# Patient Record
Sex: Female | Born: 1990 | Race: Black or African American | Hispanic: No | Smoking: Never smoker
Health system: Southern US, Community
[De-identification: ages and names within clinical notes are randomized; demographics above are authoritative.]

## PROBLEM LIST (undated history)

## (undated) ENCOUNTER — Inpatient Hospital Stay (HOSPITAL_COMMUNITY): Payer: Self-pay

## (undated) DIAGNOSIS — K759 Inflammatory liver disease, unspecified: Secondary | ICD-10-CM

## (undated) HISTORY — PX: NO PAST SURGERIES: SHX2092

---

## 2017-07-24 ENCOUNTER — Ambulatory Visit (HOSPITAL_COMMUNITY)
Admission: RE | Admit: 2017-07-24 | Discharge: 2017-07-24 | Disposition: A | Discharge status: Home / Self Care | Payer: Medicaid Other | Source: Ambulatory Visit | Attending: Family Medicine | Admitting: Family Medicine

## 2017-07-24 ENCOUNTER — Ambulatory Visit (INDEPENDENT_AMBULATORY_CARE_PROVIDER_SITE_OTHER): Payer: Medicaid Other | Admitting: Family Medicine

## 2017-07-24 ENCOUNTER — Other Ambulatory Visit: Payer: Self-pay

## 2017-07-24 VITALS — BP 100/58 | HR 69 | Temp 98.1°F | Ht 65.0 in | Wt 146.0 lb

## 2017-07-24 DIAGNOSIS — Z0289 Encounter for other administrative examinations: Secondary | ICD-10-CM | POA: Diagnosis not present

## 2017-07-24 DIAGNOSIS — Q740 Other congenital malformations of upper limb(s), including shoulder girdle: Secondary | ICD-10-CM

## 2017-07-24 DIAGNOSIS — Z3490 Encounter for supervision of normal pregnancy, unspecified, unspecified trimester: Secondary | ICD-10-CM | POA: Diagnosis not present

## 2017-07-24 DIAGNOSIS — M25439 Effusion, unspecified wrist: Secondary | ICD-10-CM

## 2017-07-24 DIAGNOSIS — M21932 Unspecified acquired deformity of left forearm: Secondary | ICD-10-CM | POA: Insufficient documentation

## 2017-07-24 HISTORY — DX: Encounter for other administrative examinations: Z02.89

## 2017-07-24 HISTORY — DX: Other congenital malformations of upper limb(s), including shoulder girdle: Q74.0

## 2017-07-24 NOTE — Assessment & Plan Note (Signed)
She has not seen or had any prenatal care thus far.  She has a follow-up appointment tomorrow at the health department for her first prenatal visit.  We did obtain fundal height today which were consistent with her last menstrual period.  Heart tones were in the 140s.  No red flags

## 2017-07-24 NOTE — Assessment & Plan Note (Signed)
BL wrist swelling.   - Left wrist prominent over ulnar styloid.  Tender to deep palpation.  I expect this to be a ganglion cyst - however it is much more hard and adhered than I expected.  I am unable to differentiate any difference from the ulnar bone.  We will obtain x-rays to further elucidate this.  If the x-ray is not helpful we will send for ultrasound.

## 2017-07-24 NOTE — Progress Notes (Signed)
Swahili interpreter Sullivan LoneGilbert from [employer] utilized during today's visit.  [facility] New [facility] Visit  HPI:  [facility] a new patient appointment to establish general primary care.  She reports that her last menstrual period was in June of this year.  She is unsure exactly when.  She denies she is pregnant.  [facility] to establish there is a patient for pregnancy..  ROS: No headaches.  No lower extremity edema.  No vaginal bleeding.  She has occasional back pain.  Also some dental pain.  Otherwise negative.   Past Medical Hx:  -Denies  Past Surgical Hx:  -Denies  Family Hx: updated in Epic - Number of family members: Husband and 1 daughter aged 1 year and 3 months.  [place] are both living in the [place] with her.  Rest of the family [place] back in [place].  Immigrant Social History: - Date arrived in [place]: June 27, [place] - Country of origin[place] - Location of refugee camp (if applicable), how long there, and what caused patient to leave home country?:  [place] - Primary language: Swahili  -Requires intepreter (essentially speaks no [place]) - Marriage Status: Married - Sexual activity: With men - Class B conditions: None - Were you beaten or tortured in your country or refugee camp?  No answer  - if yes:  Are you having bad dreams about your experience?     Do you feel "jumpy" or "nervous?"     Do you feel that the experience is happening again?     Are you "super alert" or watchful?    PHYSICAL EXAM: BP (!) 100/58   Pulse 69   Temp 98.1 F (36.7 C) (Oral)   Ht 5\' 5"  (1.651 m)   Wt 146 lb (66.2 kg)   LMP 02/09/2017 (Approximate)   SpO2 100%   BMI 24.30 kg/m  Gen:  Alert, cooperative patient who appears stated age in no acute distress.  Vital signs reviewed. Head: [place]/AT.   Eyes:  EOMI, PERRL.   Ears:  External ears WNL, Bilateral TM's  normal without retraction, redness or bulging. Nose:  Septum midline  Mouth:  MMM, tonsils non-erythematous, non-edematous.   Cardiac:  Regular rate and rhythm without murmur auscultated.  Good S1/S2. Pulm:  Clear to auscultation bilaterally with good air movement.  No wheezes or rales noted.   Abd: Fundal height at 18 cm.  Fetal heart tones 140s.  Good bowel sounds. Extremities: No lower extremity edema Musculoskeletal patient is a 5 cm x 5 cm swelling located over the dorsum of her ulnar styloid process.  This is tender to palpation with deep palpation.  This feels very hard and fixed and unlike a typical ganglion cyst.  I am unable to separate this from the rest of her ulnar bone.  Also with 2 x 2 centimeter swelling over the right ulnar styloid.  This is nontender. Psych: Very flat affect.  Husband answer most questions.  She would answer in monosyllables if at all.

## 2017-07-24 NOTE — Assessment & Plan Note (Signed)
Overall doing well.  As noted she will be seen in the health department tomorrow. -I am a little concerned because of how flat her affect is.  Her husband answered most of her questions.  We will need to further investigate any past history of trauma or any evidence of PTSD at our next visit without him in the room.   - Labs obtained at health dept.  Will contact them for records.

## 2017-07-24 NOTE — Patient Instructions (Signed)
It was good to you today.  Go to the hospital anytime this week.  You do not need an appointment.  Show them this paper below.  -----------------------------------------------  - MY DOCTOR WANTS ME TO HAVE AN XRAY OF MY WRISTS. - PLEASE HELP ME FIND THE RADIOLOGY DEPARTMENT. - I NEED A SWAHILI INTERPRETER

## 2017-08-17 LAB — OB RESULTS CONSOLE RPR: RPR: NONREACTIVE

## 2017-08-17 LAB — OB RESULTS CONSOLE HEPATITIS B SURFACE ANTIGEN: HEP B S AG: NEGATIVE

## 2017-08-17 LAB — OB RESULTS CONSOLE ABO/RH: RH Type: POSITIVE

## 2017-08-17 LAB — OB RESULTS CONSOLE ANTIBODY SCREEN: Antibody Screen: NEGATIVE

## 2017-08-17 LAB — OB RESULTS CONSOLE GC/CHLAMYDIA
Chlamydia: NEGATIVE
Gonorrhea: NEGATIVE

## 2017-08-17 LAB — OB RESULTS CONSOLE RUBELLA ANTIBODY, IGM: RUBELLA: IMMUNE

## 2017-08-17 LAB — OB RESULTS CONSOLE HIV ANTIBODY (ROUTINE TESTING): HIV: NONREACTIVE

## 2017-08-27 DIAGNOSIS — Z3482 Encounter for supervision of other normal pregnancy, second trimester: Secondary | ICD-10-CM | POA: Diagnosis not present

## 2017-09-11 NOTE — L&D Delivery Note (Signed)
Delivery Note At 7:46 PM a viable female was delivered via Vaginal, Spontaneous (Presentation:LOA).  APGAR: 8, 9; weight pending.  Placenta status: Spontaneous, intact.  Cord: 3 vessels  Anesthesia:  none Episiotomy:  none Lacerations:  1st degree, hemodynamically stable Suture Repair: n/a Est. Blood Loss (mL):  300  Mom to postpartum.  Baby to Couplet care / Skin to Skin.  Jill Huff 11/07/2017, 7:54 PM

## 2017-10-22 LAB — OB RESULTS CONSOLE GBS: GBS: NEGATIVE

## 2017-11-02 DIAGNOSIS — Z3493 Encounter for supervision of normal pregnancy, unspecified, third trimester: Secondary | ICD-10-CM | POA: Diagnosis not present

## 2017-11-02 DIAGNOSIS — B181 Chronic viral hepatitis B without delta-agent: Secondary | ICD-10-CM | POA: Diagnosis not present

## 2017-11-07 ENCOUNTER — Other Ambulatory Visit: Payer: Self-pay

## 2017-11-07 ENCOUNTER — Inpatient Hospital Stay (HOSPITAL_COMMUNITY)
Admission: AD | Admit: 2017-11-07 | Discharge: 2017-11-09 | DRG: 807 | Disposition: A | Discharge status: Home / Self Care | Payer: Medicaid Other | Source: Ambulatory Visit | Attending: Obstetrics & Gynecology | Admitting: Obstetrics & Gynecology

## 2017-11-07 ENCOUNTER — Encounter (HOSPITAL_COMMUNITY): Payer: Self-pay | Admitting: *Deleted

## 2017-11-07 DIAGNOSIS — Z3A38 38 weeks gestation of pregnancy: Secondary | ICD-10-CM

## 2017-11-07 DIAGNOSIS — O4292 Full-term premature rupture of membranes, unspecified as to length of time between rupture and onset of labor: Secondary | ICD-10-CM | POA: Diagnosis present

## 2017-11-07 HISTORY — DX: Inflammatory liver disease, unspecified: K75.9

## 2017-11-07 LAB — CBC
HCT: 42 % (ref 36.0–46.0)
Hemoglobin: 13.9 g/dL (ref 12.0–15.0)
MCH: 26.7 pg (ref 26.0–34.0)
MCHC: 33.1 g/dL (ref 30.0–36.0)
MCV: 80.8 fL (ref 78.0–100.0)
Platelets: 133 10*3/uL — ABNORMAL LOW (ref 150–400)
RBC: 5.2 MIL/uL — ABNORMAL HIGH (ref 3.87–5.11)
RDW: 14.6 % (ref 11.5–15.5)
WBC: 11.9 10*3/uL — ABNORMAL HIGH (ref 4.0–10.5)

## 2017-11-07 LAB — ABO/RH: ABO/RH(D): O POS

## 2017-11-07 LAB — TYPE AND SCREEN
ABO/RH(D): O POS
ANTIBODY SCREEN: NEGATIVE

## 2017-11-07 LAB — POCT FERN TEST: POCT FERN TEST: POSITIVE

## 2017-11-07 MED ORDER — DIBUCAINE 1 % RE OINT
1.0000 | TOPICAL_OINTMENT | RECTAL | Status: DC | PRN
Start: 2017-11-07 — End: 2017-11-09

## 2017-11-07 MED ORDER — DIPHENHYDRAMINE HCL 25 MG PO CAPS: 25.0000 mg | ORAL_CAPSULE | Freq: Four times a day (QID) | ORAL | Status: DC | PRN

## 2017-11-07 MED ORDER — LIDOCAINE HCL (PF) 1 % IJ SOLN
INTRAMUSCULAR | Status: AC
  Filled 2017-11-07: qty 30

## 2017-11-07 MED ORDER — WITCH HAZEL-GLYCERIN EX PADS: 1.0000 "application " | MEDICATED_PAD | CUTANEOUS | Status: DC | PRN

## 2017-11-07 MED ORDER — ONDANSETRON HCL 4 MG/2ML IJ SOLN: 4.0000 mg | Freq: Four times a day (QID) | INTRAMUSCULAR | Status: DC | PRN

## 2017-11-07 MED ORDER — COCONUT OIL OIL: 1.0000 "application " | TOPICAL_OIL | Status: DC | PRN

## 2017-11-07 MED ORDER — IBUPROFEN 600 MG PO TABS
600.0000 mg | ORAL_TABLET | Freq: Four times a day (QID) | ORAL | Status: DC
  Administered 2017-11-08 – 2017-11-09 (×7): 600 mg via ORAL
  Filled 2017-11-07 (×7): qty 1

## 2017-11-07 MED ORDER — BENZOCAINE-MENTHOL 20-0.5 % EX AERO
1.0000 "application " | INHALATION_SPRAY | CUTANEOUS | Status: DC | PRN
  Filled 2017-11-07: qty 56

## 2017-11-07 MED ORDER — OXYTOCIN 40 UNITS IN LACTATED RINGERS INFUSION - SIMPLE MED
INTRAVENOUS | Status: AC
  Filled 2017-11-07: qty 1000

## 2017-11-07 MED ORDER — SOD CITRATE-CITRIC ACID 500-334 MG/5ML PO SOLN: 30.0000 mL | ORAL | Status: DC | PRN

## 2017-11-07 MED ORDER — ACETAMINOPHEN 325 MG PO TABS: 650.0000 mg | ORAL_TABLET | ORAL | Status: DC | PRN

## 2017-11-07 MED ORDER — OXYCODONE-ACETAMINOPHEN 5-325 MG PO TABS
1.0000 | ORAL_TABLET | ORAL | Status: DC | PRN
  Administered 2017-11-07: 1 via ORAL
  Filled 2017-11-07: qty 1

## 2017-11-07 MED ORDER — SIMETHICONE 80 MG PO CHEW: 80.0000 mg | CHEWABLE_TABLET | ORAL | Status: DC | PRN

## 2017-11-07 MED ORDER — LIDOCAINE HCL (PF) 1 % IJ SOLN
30.0000 mL | INTRAMUSCULAR | Status: DC | PRN
  Administered 2017-11-07: 30 mL via SUBCUTANEOUS
  Filled 2017-11-07: qty 30

## 2017-11-07 MED ORDER — ONDANSETRON HCL 4 MG PO TABS: 4.0000 mg | ORAL_TABLET | ORAL | Status: DC | PRN

## 2017-11-07 MED ORDER — SENNOSIDES-DOCUSATE SODIUM 8.6-50 MG PO TABS
2.0000 | ORAL_TABLET | ORAL | Status: DC
  Administered 2017-11-08 (×2): 2 via ORAL
  Filled 2017-11-07 (×2): qty 2

## 2017-11-07 MED ORDER — FENTANYL CITRATE (PF) 100 MCG/2ML IJ SOLN
100.0000 ug | Freq: Once | INTRAMUSCULAR | Status: AC
Start: 2017-11-07 — End: 2017-11-07
  Administered 2017-11-07: 100 ug via INTRAVENOUS

## 2017-11-07 MED ORDER — OXYCODONE-ACETAMINOPHEN 5-325 MG PO TABS: 2.0000 | ORAL_TABLET | ORAL | Status: DC | PRN

## 2017-11-07 MED ORDER — ZOLPIDEM TARTRATE 5 MG PO TABS: 5.0000 mg | ORAL_TABLET | Freq: Every evening | ORAL | Status: DC | PRN

## 2017-11-07 MED ORDER — OXYTOCIN BOLUS FROM INFUSION
500.0000 mL | Freq: Once | INTRAVENOUS | Status: AC
  Administered 2017-11-07: 500 mL via INTRAVENOUS

## 2017-11-07 MED ORDER — TETANUS-DIPHTH-ACELL PERTUSSIS 5-2.5-18.5 LF-MCG/0.5 IM SUSP: 0.5000 mL | Freq: Once | INTRAMUSCULAR | Status: DC

## 2017-11-07 MED ORDER — ACETAMINOPHEN 325 MG PO TABS
650.0000 mg | ORAL_TABLET | ORAL | Status: DC | PRN
  Administered 2017-11-08 – 2017-11-09 (×2): 650 mg via ORAL
  Filled 2017-11-07 (×2): qty 2

## 2017-11-07 MED ORDER — FENTANYL CITRATE (PF) 100 MCG/2ML IJ SOLN
INTRAMUSCULAR | Status: AC
  Filled 2017-11-07: qty 2

## 2017-11-07 MED ORDER — LACTATED RINGERS IV SOLN: INTRAVENOUS | Status: DC

## 2017-11-07 MED ORDER — INFLUENZA VAC SPLIT QUAD 0.5 ML IM SUSY: 0.5000 mL | PREFILLED_SYRINGE | INTRAMUSCULAR | Status: DC

## 2017-11-07 MED ORDER — ONDANSETRON HCL 4 MG/2ML IJ SOLN: 4.0000 mg | INTRAMUSCULAR | Status: DC | PRN

## 2017-11-07 MED ORDER — OXYTOCIN 40 UNITS IN LACTATED RINGERS INFUSION - SIMPLE MED
2.5000 [IU]/h | INTRAVENOUS | Status: DC
  Administered 2017-11-07: 2.5 [IU]/h via INTRAVENOUS

## 2017-11-07 MED ORDER — LACTATED RINGERS IV SOLN: 500.0000 mL | INTRAVENOUS | Status: DC | PRN

## 2017-11-07 MED ORDER — PRENATAL MULTIVITAMIN CH
1.0000 | ORAL_TABLET | Freq: Every day | ORAL | Status: DC
  Administered 2017-11-08 – 2017-11-09 (×2): 1 via ORAL
  Filled 2017-11-07 (×2): qty 1

## 2017-11-07 NOTE — H&P (Signed)
OBSTETRIC ADMISSION HISTORY AND PHYSICAL  Jill Huff is a 27 y.o. female G2P1001 with IUP at [redacted]w[redacted]d by LMP presenting for spontaneous onset of labor with rupture of membranes. She reports clear watery discharged since 0400 and contractions that have gotten progressively worse. She reports +FMs, some bloody show, no blurry vision, headaches or peripheral edema, and RUQ pain.  She plans on breast feeding. She request condoms for birth control. She received her prenatal [facility] at [facility]   Dating: By LMP --->  Estimated Date of Delivery: 11/18/17  Prenatal History/Complications:  Past Medical History: Past Medical History:  Diagnosis Date  . Hepatitis     Past Surgical History: Past Surgical History:  Procedure Laterality Date  . NO PAST SURGERIES      Obstetrical History: OB History    Gravida Para Term Preterm AB Living   2 1 1     1    SAB TAB Ectopic Multiple Live Births                  Social History: Social History   Socioeconomic History  . Marital status: Unknown    Spouse name: None  . Number of children: None  . Years of education: None  . Highest education level: None  Social Needs  . Financial resource strain: None  . Food insecurity - worry: None  . Food insecurity - inability: None  . Transportation needs - medical: None  . Transportation needs - non-medical: None  Occupational History  . None  Tobacco Use  . Smoking status: Never Smoker  . Smokeless tobacco: Never Used  Substance and Sexual Activity  . Alcohol use: No    Frequency: Never  . Drug use: No  . Sexual activity: Yes  Other Topics Concern  . None  Social History Narrative  . None    Family History: History reviewed. No pertinent family history.  Allergies: No Known Allergies  No medications prior to admission.     Review of Systems   All systems reviewed and negative except as stated in HPI  Blood pressure 118/84, pulse 69, temperature 98.1 F (36.7 C), temperature source  Oral, resp. rate 18, height 5\' 5"  (1.651 m), weight 162 lb (73.5 kg), last menstrual period 02/09/2017, SpO2 100 %. General appearance: alert, cooperative and no distress Lungs: clear to auscultation bilaterally Heart: regular rate and rhythm Abdomen: soft, non-tender; bowel sounds normal Pelvic: n/a Extremities: Homans sign is negative, no sign of DVT DTR's +2 Presentation: cephalic Fetal monitoringBaseline: 140 bpm, Variability: Good {> 6 bpm), Accelerations: Reactive and Decelerations: Absent Uterine activityFrequency: Every 1-2 minutes Dilation: Lip/rim Effacement (%): 100 Station: -1 Exam by:: Dorrene German RN   Prenatal labs: ABO, Rh:   Antibody:   Rubella:   RPR:    HBsAg:    HIV:    GBS: Negative (02/11 0000)    Prenatal Transfer Tool  Maternal Diabetes: No Genetic Screening: Normal Maternal Ultrasounds/Referrals: Normal Fetal Ultrasounds or other Referrals:  None Maternal Substance Abuse:  No Significant Maternal Medications:  None Significant Maternal Lab Results: Lab values include: Group B Strep negative  Results for orders placed or performed during the hospital encounter of 11/07/17 (from the past 24 hour(s))  POCT fern test   Collection Time: 11/07/17  5:55 PM  Result Value Ref Range   POCT Fern Test Positive = ruptured amniotic membanes   CBC   Collection Time: 11/07/17  6:13 PM  Result Value Ref Range   WBC 11.9 (H) 4.0 -  10.5 K/uL   RBC 5.20 (H) 3.87 - 5.11 MIL/uL   Hemoglobin 13.9 12.0 - 15.0 g/dL   HCT 16.142.0 09.636.0 - 04.546.0 %   MCV 80.8 78.0 - 100.0 fL   MCH 26.7 26.0 - 34.0 pg   MCHC 33.1 30.0 - 36.0 g/dL   RDW 40.914.6 81.111.5 - 91.415.5 %   Platelets 133 (L) 150 - 400 K/uL    Patient Active Problem List   Diagnosis Date Noted  . Indication for care in labor or delivery 11/07/2017  . Refugee health examination 07/24/2017  . Encounter for supervision of normal pregnancy 07/24/2017  . Wrist swelling, unspecified laterality 07/24/2017    Assessment/Plan:   Jill Huff is a 27 y.o. G2P1001 at 5959w3d here for SROM, spontaneous onset of labor  #Labor:Expectant management, anticipate NSVD soon #Pain: Per patient request #FWB: Cat 1 #ID:  GBS neg #MOF: Breast #MOC: Condom #Circ:  n/a  Rolm BookbinderCaroline M Yamaris Cummings, CNM  11/07/2017, 6:34 PM

## 2017-11-07 NOTE — Progress Notes (Signed)
[facility]mission [facility]

## 2017-11-07 NOTE — MAU Note (Signed)
Pt C/O uc's since 0400, states she has had watery bloody discharge.

## 2017-11-07 NOTE — Progress Notes (Signed)
Interpreter called

## 2017-11-08 LAB — CBC
HEMATOCRIT: 37.5 % (ref 36.0–46.0)
Hemoglobin: 12.3 g/dL (ref 12.0–15.0)
MCH: 26.2 pg (ref 26.0–34.0)
MCHC: 32.8 g/dL (ref 30.0–36.0)
MCV: 80 fL (ref 78.0–100.0)
Platelets: 126 10*3/uL — ABNORMAL LOW (ref 150–400)
RBC: 4.69 MIL/uL (ref 3.87–5.11)
RDW: 14.4 % (ref 11.5–15.5)
WBC: 9.3 10*3/uL (ref 4.0–10.5)

## 2017-11-08 LAB — RPR: RPR: NONREACTIVE

## 2017-11-08 NOTE — Lactation Note (Signed)
This note was copied from a baby's chart. Lactation Consultation Note Baby 9 hrs old. Baby BF after delivery but hasn't BF since. Mom stated baby sleepy. Baby laying in crib awake looking around quietly.  Used Swahili interpreter [employer] [phone] Jill Huff. Mom plans on breast/formula feed baby. Mom has 1 1/25 yr old that she BF for 1 yr. Mom BF exclusively for 6 months then used formula w/BM.  Mom has round breast w/small everted nipples. Mom has some stretch marks from shoulders down towards axillary to upper chest. Loraine LericheMark to Lt. Outer is very wide looks like a scar. Mom stated it isn't a scar. Noted a couple of stretch marks to upper breast. After thinking about marks, wonder if were cuts. Appear to be symmetrical. Unwrapped baby and assisted in latch in cradle position. Baby searched and pops and on frequently.  Newborn behavior, STS, I&O, supply and demand discussed. Mom encouraged to feed baby 8-12 times/24 hours and with feeding cues. Mom encouraged to waken baby for feeds if hasn't cued in 3 hrs. Mom stated she had no further questions. Encouraged to call for assistance.   WH/LC brochure given w/resources, support groups and LC services. Patient Name: Jill Radene GunningSolange Huff EAVWU'JToday's Date: 11/08/2017 Reason for consult: Initial assessment   Maternal Data Has patient been taught Hand Expression?: Yes Does the patient have breastfeeding experience prior to this delivery?: Yes  Feeding Feeding Type: Breast Fed Length of feed: 10 min(still BF)  LATCH Score Latch: Repeated attempts needed to sustain latch, nipple held in mouth throughout feeding, stimulation needed to elicit sucking reflex.  Audible Swallowing: None  Type of Nipple: Everted at rest and after stimulation  Comfort (Breast/Nipple): Soft / non-tender  Hold (Positioning): Assistance needed to correctly position infant at breast and maintain latch.  LATCH Score: 6  Interventions Interventions: Breast feeding basics  reviewed;Assisted with latch;Breast compression;Skin to skin;Adjust position;Breast massage;Support pillows;Hand express;Position options  Lactation Tools Discussed/Used WIC Program: Yes   Consult Status Consult Status: Follow-up Date: 11/09/17 Follow-up type: In-patient    Bellarae Lizer, Diamond NickelLAURA G 11/08/2017, 5:07 AM

## 2017-11-08 NOTE — Progress Notes (Signed)
[employer] interpreter [id number] translates maternal & infant care and safety and breast feeding basics. Pt states understanding information.

## 2017-11-08 NOTE — Progress Notes (Signed)
[employer] interpreter [id number] translates [place] Post[place] Depression scale, infant and maternal care and breast feeding. Mother states understanding info.

## 2017-11-08 NOTE — Progress Notes (Signed)
[   LATE ENTRY]  Called to bedside earlier in the evening d/t persistent vaginal bleeding, thought to be coming from lacerations. On evaluation, fundus was firm, and patient had persistent bleeding from perineal tear, as small small amount of bleeding from right periurethral laceration. I repaired lacerations with 3.0 Vycril after given patient IV pain medication and local lidocaine. Good hemostasis was achieved. Fundal massage done after repairs, and she was noted to have moderate amount of blood coming through cervix. Fundus firm. LUS explored and found to have moderate amount of clots which were cleared. Good hemostasis noted after this both from uterus and from repaired lacerations.   Raynelle FanningJulie P. Halie Gass, MD OB Fellow

## 2017-11-08 NOTE — Progress Notes (Signed)
POSTPARTUM PROGRESS NOTE  Post Partum Day 1 Subjective:  Jill Huff is a 27 y.o. G2P1001 3562w4d s/p SVD. Overnight, patient had persistent bleeding noted from perineal tear, repaired at bedside with no complications. Pt denies problems with ambulating, voiding or po intake.  She denies nausea or vomiting.  Pain is well controlled. Lochia Small.   Objective: Blood pressure 99/60, pulse 68, temperature 98.2 F (36.8 C), temperature source Oral, resp. rate 18, height 5\' 5"  (1.651 m), weight 162 lb (73.5 kg), last menstrual period 02/09/2017, SpO2 100 %.  Physical Exam:  General: alert, cooperative and no distress Lochia:normal flow Chest: no respiratory distress Heart:regular rate, distal pulses intact Abdomen: soft, nontender,  Uterine Fundus: firm, appropriately tender Perineal exam without signs of infection. DVT Evaluation: No calf swelling or tenderness Extremities: no edema  Recent Labs    11/07/17 1813 11/08/17 0531  HGB 13.9 12.3  HCT 42.0 37.5    Assessment/Plan:  ASSESSMENT: Jill GunningSolange Males is a 27 y.o. G2P1001 1062w4d s/p SVD  Plan for discharge tomorrow and Breastfeeding   LOS: 1 day   Ellwood Denselison Mosi Hannold, DO 11/08/2017, 7:48 AM

## 2017-11-09 MED ORDER — IBUPROFEN 600 MG PO TABS: 600.0000 mg | ORAL_TABLET | Freq: Four times a day (QID) | ORAL | 0 refills | Status: DC

## 2017-11-09 NOTE — Discharge Summary (Signed)
OB Discharge Summary    Patient Name: Jill GunningSolange Letourneau DOB: 04/14/1991 MRN: [id number] Date of admission: 11/07/2017  Delivering MD: Rolm BookbinderNEILL, CAROLINE M )  Date of discharge: 11/09/2017  Admitting diagnosis: SROM Intrauterine pregnancy: 6036w5d    Secondary diagnosis:  Active Problems:   Patient Active Problem List   Diagnosis Date Noted  . Indication for care in labor or delivery 11/07/2017  . Refugee health examination 07/24/2017  . Encounter for supervision of normal pregnancy 07/24/2017  . Wrist swelling, unspecified laterality 07/24/2017   Additional problems:      Discharge diagnosis: Term Pregnancy Delivered                                                                                                Post partum procedures:repair of persistently bleeding perineal and periurethral lacerations, no complications noted  Complications: None  Hospital course:  Onset of Labor With Vaginal Delivery     27 y.o. yo G2P1001 at 5536w5d was admitted in Active Labor on 11/07/2017. Patient had an uncomplicated labor course as follows:  Membrane Rupture Time/Date: 4:00 AM ,11/07/2017   Intrapartum Procedures: Episiotomy:   None                                        Lacerations:  1st degree [2];Perineal [11];Periurethral [8];2nd degree [3]  Patient had a delivery of a Viable infant. 11/07/2017  Information for the patient's newborn:  Sherald BargeWasi, Girl Zeynep [[id number]][[id number]]  Delivery Method: Vag-Spont    Patient's postpartum course notable for persistently bleeding periurethral and perineal lacerations that were repaired a few hours after delivery with no complications.  She is ambulating, tolerating a regular diet, passing flatus, and urinating well. Patient is discharged home in stable condition on 11/09/17.  Physical exam  Vitals:   11/08/17 1737 11/09/17 0607  BP: 110/64 (!) 94/54  Pulse: 72 70  Resp: 18 17  Temp: 97.8 F (36.6 C) 98 F (36.7 C)  SpO2:  100%    General: alert, cooperative and no  distress CV: regular rate and rhythm Lungs: CTA Lochia: appropriate Uterine Fundus: firm Incision: N/A DVT Evaluation: No evidence of DVT seen on physical exam.  Labs: No results found for this or any previous visit (from the past 24 hour(s)).   Discharge instruction: per After Visit Summary and "Baby and Me Booklet".  After visit meds:  No Known Allergies  Allergies as of 11/09/2017   No Known Allergies     Medication List    TAKE these medications   ibuprofen 600 MG tablet Commonly known as:  ADVIL,MOTRIN Take 1 tablet (600 mg total) by mouth every 6 (six) hours.   prenatal multivitamin Tabs tablet Take 1 tablet by mouth daily at 12 noon.      Diet: routine diet  Activity: Advance as tolerated. Pelvic rest for 6 weeks.   Outpatient follow up:4 weeks Future Appointments: No future appointments.  Follow up Appt: No Follow-up on file.  Postpartum contraception: Condoms  Newborn Data: APGAR (1 MIN): 8  APGAR (5 MINS): 9    Baby Feeding: Breast, on [facility] Disposition:home with mother  Ellwood Dense, DO  11/09/2017

## 2017-11-09 NOTE — Discharge Instructions (Signed)
Vaginal Delivery, Care After °Refer to this sheet in the next few weeks. These instructions provide you with information about caring for yourself after vaginal delivery. Your health care provider may also give you more specific instructions. Your treatment has been planned according to current medical practices, but problems sometimes occur. Call your health care provider if you have any problems or questions. °What can I expect after the procedure? °After vaginal delivery, it is common to have: °· Some bleeding from your vagina. °· Soreness in your abdomen, your vagina, and the area of skin between your vaginal opening and your anus (perineum). °· Pelvic cramps. °· Fatigue. ° °Follow these instructions at home: °Medicines °· Take over-the-counter and prescription medicines only as told by your health care provider. °· If you were prescribed an antibiotic medicine, take it as told by your health care provider. Do not stop taking the antibiotic until it is finished. °Driving ° °· Do not drive or operate heavy machinery while taking prescription pain medicine. °· Do not drive for 24 hours if you received a sedative. °Lifestyle °· Do not drink alcohol. This is especially important if you are breastfeeding or taking medicine to relieve pain. °· Do not use tobacco products, including cigarettes, chewing tobacco, or e-cigarettes. If you need help quitting, ask your health care provider. °Eating and drinking °· Drink at least 8 eight-ounce glasses of water every day unless you are told not to by your health care provider. If you choose to breastfeed your baby, you may need to drink more water than this. °· Eat high-fiber foods every day. These foods may help prevent or relieve constipation. High-fiber foods include: °? Whole grain cereals and breads. °? Brown rice. °? Beans. °? Fresh fruits and vegetables. °Activity °· Return to your normal activities as told by your health care provider. Ask your health care provider  what activities are safe for you. °· Rest as much as possible. Try to rest or take a nap when your baby is sleeping. °· Do not lift anything that is heavier than your baby or 10 lb (4.5 kg) until your health care provider says that it is safe. °· Talk with your health care provider about when you can engage in sexual activity. This may depend on your: °? Risk of infection. °? Rate of healing. °? Comfort and desire to engage in sexual activity. °Vaginal Care °· If you have an episiotomy or a vaginal tear, check the area every day for signs of infection. Check for: °? More redness, swelling, or pain. °? More fluid or blood. °? Warmth. °? Pus or a bad smell. °· Do not use tampons or douches until your health care provider says this is safe. °· Watch for any blood clots that may pass from your vagina. These may look like clumps of dark red, brown, or black discharge. °General instructions °· Keep your perineum clean and dry as told by your health care provider. °· Wear loose, comfortable clothing. °· Wipe from front to back when you use the toilet. °· Ask your health care provider if you can shower or take a bath. If you had an episiotomy or a perineal tear during labor and delivery, your health care provider may tell you not to take baths for a certain length of time. °· Wear a bra that supports your breasts and fits you well. °· If possible, have someone help you with household activities and help care for your baby for at least a few days after   you leave the hospital. °· Keep all follow-up visits for you and your baby as told by your health care provider. This is important. °Contact a health care provider if: °· You have: °? Vaginal discharge that has a bad smell. °? Difficulty urinating. °? Pain when urinating. °? A sudden increase or decrease in the frequency of your bowel movements. °? More redness, swelling, or pain around your episiotomy or vaginal tear. °? More fluid or blood coming from your episiotomy or  vaginal tear. °? Pus or a bad smell coming from your episiotomy or vaginal tear. °? A fever. °? A rash. °? Little or no interest in activities you used to enjoy. °? Questions about caring for yourself or your baby. °· Your episiotomy or vaginal tear feels warm to the touch. °· Your episiotomy or vaginal tear is separating or does not appear to be healing. °· Your breasts are painful, hard, or turn red. °· You feel unusually sad or worried. °· You feel nauseous or you vomit. °· You pass large blood clots from your vagina. If you pass a blood clot from your vagina, save it to show to your health care provider. Do not flush blood clots down the toilet without having your health care provider look at them. °· You urinate more than usual. °· You are dizzy or light-headed. °· You have not breastfed at all and you have not had a menstrual period for 12 weeks after delivery. °· You have stopped breastfeeding and you have not had a menstrual period for 12 weeks after you stopped breastfeeding. °Get help right away if: °· You have: °? Pain that does not go away or does not get better with medicine. °? Chest pain. °? Difficulty breathing. °? Blurred vision or spots in your vision. °? Thoughts about hurting yourself or your baby. °· You develop pain in your abdomen or in one of your legs. °· You develop a severe headache. °· You faint. °· You bleed from your vagina so much that you fill two sanitary pads in one hour. °This information is not intended to replace advice given to you by your health care provider. Make sure you discuss any questions you have with your health care provider. °Document Released: 08/25/2000 Document Revised: 02/09/2016 Document Reviewed: 09/12/2015 °Elsevier Interactive Patient Education © 2018 Elsevier Inc. ° °

## 2017-11-09 NOTE — Progress Notes (Signed)
CSW received consult for assistance with WIC and car seat.  Consult also states limited PNC/refugee from [place].  PNR states MOB is linked with a Social Worker.  These resources are handled by other professionals in the hospital.  A representative from WIC is available in the hospital and Volunteer Services can assist with a car seat for $30 if needed.  CSW will follow CDS, but feels her relocation from [place] to the [place] most likely interfered with PNC at beginning of pregnancy.  Her PNC appears consistent since establishment. 

## 2017-12-24 ENCOUNTER — Ambulatory Visit: Payer: Medicaid Other | Admitting: Family Medicine

## 2018-01-01 ENCOUNTER — Ambulatory Visit (INDEPENDENT_AMBULATORY_CARE_PROVIDER_SITE_OTHER): Payer: Medicaid Other | Admitting: Family Medicine

## 2018-01-01 ENCOUNTER — Other Ambulatory Visit: Payer: Self-pay

## 2018-01-01 ENCOUNTER — Encounter: Payer: Self-pay | Admitting: Family Medicine

## 2018-01-01 VITALS — BP 98/52 | HR 76 | Temp 98.0°F | Ht 65.0 in | Wt 150.4 lb

## 2018-01-01 DIAGNOSIS — H93293 Other abnormal auditory perceptions, bilateral: Secondary | ICD-10-CM

## 2018-01-01 DIAGNOSIS — M25439 Effusion, unspecified wrist: Secondary | ICD-10-CM | POA: Diagnosis not present

## 2018-01-01 HISTORY — DX: Other abnormal auditory perceptions, bilateral: H93.293

## 2018-01-01 NOTE — Progress Notes (Signed)
    Subjective:  Jill GunningSolange Huff is a 27 y.o. female who presents to the [facility] today with a chief complaint of wrist pain bilaterally.   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  She adamantly denies any trauma/injury.   She still has use of hands and with no sensation/motor deficits.  No skin breakdown and no complaints of erythema.  She had some xrays done but has not spoken with anyone about the results at this time that she can recall.  There has been no dramatic increase recently in pain/displacement, just chronic worsening.  She also complains of an occasional "beeping" in her ears, worse on left.  She does not complain of pain or hearing deficit.    Objective:  Physical Exam: BP (!) 98/52   Pulse 76   Temp 98 F (36.7 C) (Oral)   Ht 5\' 5"  (1.651 m)   Wt 150 lb 6.4 oz (68.2 kg)   LMP 02/09/2017 (Approximate)   SpO2 99%   BMI 25.03 kg/m   Gen: NAD, resting comfortably CV: RRR with no murmurs appreciated Pulm: NWOB, CTAB with no crackles, wheezes, or rhonchi GI: Normal bowel sounds present. Soft, Nontender, Nondistended. MSK: Wrist are bilaterally deformed with ulnar deviation at wrist.  Sensation/strneght is intact distally.  No skin changes over bony deformation.   Skin: warm, dry Neuro: grossly normal, moves all extremities Psych: Normal affect and thought content  No results found for this or any previous visit (from the past 72 hour(s)).   Assessment/Plan:  Wrist swelling, unspecified laterality Xrays with significant deviation, no current neuro deficits  referal to hand surgery  Auditory complaints of both ears Complaint of painless "beeping" intermittently in both ears  Ear exam shows clear TM with no erythema in canal, no pain on exam.   Will evaluate in future for spontaneous resolution as there are no current symptoms   Marthenia RollingScott Anahid Eskelson, DO FAMILY MEDICINE RESIDENT -  PGY1 01/01/2018 12:15 PM

## 2018-01-01 NOTE — Assessment & Plan Note (Signed)
Complaint of painless "beeping" intermittently in both ears  Ear exam shows clear TM with no erythema in canal, no pain on exam.   Will evaluate in future for spontaneous resolution as there are no current symptoms

## 2018-01-01 NOTE — Assessment & Plan Note (Signed)
Xrays with significant deviation, no current neuro deficits  referal to hand surgery

## 2018-01-01 NOTE — Patient Instructions (Signed)
It was a pleasure to see you today! Thank you for choosing [facility] for your primary care. Marshia Wirz was seen for wrist pain and bone displacement. Come back to the clinic if you have any other routine health issues, and go to the emergency room if you have any life threatening problems.  We are sending you to see a hand surgeon to see if they can offer solution to the bones being displaced.  Please go to the emergency room if you lose feeling in your hand.    If we did any lab work today, and the results require attention, either me or my nurse will get in touch with you. If everything is normal, you will get a letter in mail and a message via . If you don't hear from us in two weeks, please give us a call. Otherwise, we look forward to seeing you again at your next visit. If you have any questions or concerns before then, please call the clinic at [phone].  Please bring all your medications to every doctors visit  Sign up for My Chart to have easy access to your labs results, and communication with your Primary care physician.    Please check-out at the front desk before leaving the clinic.    Best,  Dr. Marthenia RollingScott Naziah Portee FAMILY MEDICINE RESIDENT - PGY1 01/01/2018 11:47 AM

## 2018-02-10 ENCOUNTER — Encounter (HOSPITAL_COMMUNITY): Payer: Self-pay

## 2018-03-15 DIAGNOSIS — Z3042 Encounter for surveillance of injectable contraceptive: Secondary | ICD-10-CM | POA: Diagnosis not present

## 2018-05-01 ENCOUNTER — Other Ambulatory Visit: Payer: Self-pay | Admitting: Nurse Practitioner

## 2018-05-01 DIAGNOSIS — B181 Chronic viral hepatitis B without delta-agent: Secondary | ICD-10-CM

## 2018-06-25 DIAGNOSIS — Z3009 Encounter for other general counseling and advice on contraception: Secondary | ICD-10-CM | POA: Diagnosis not present

## 2018-06-25 DIAGNOSIS — Z3200 Encounter for pregnancy test, result unknown: Secondary | ICD-10-CM | POA: Diagnosis not present

## 2018-06-25 DIAGNOSIS — Z3042 Encounter for surveillance of injectable contraceptive: Secondary | ICD-10-CM | POA: Diagnosis not present

## 2018-08-23 DIAGNOSIS — Z23 Encounter for immunization: Secondary | ICD-10-CM | POA: Diagnosis not present

## 2018-09-27 DIAGNOSIS — Z3042 Encounter for surveillance of injectable contraceptive: Secondary | ICD-10-CM | POA: Diagnosis not present

## 2018-09-27 DIAGNOSIS — Z3009 Encounter for other general counseling and advice on contraception: Secondary | ICD-10-CM | POA: Diagnosis not present

## 2018-10-29 ENCOUNTER — Other Ambulatory Visit: Payer: Self-pay | Admitting: Nurse Practitioner

## 2018-10-29 DIAGNOSIS — B181 Chronic viral hepatitis B without delta-agent: Secondary | ICD-10-CM | POA: Diagnosis not present

## 2018-11-05 ENCOUNTER — Other Ambulatory Visit: Payer: Medicaid Other

## 2018-11-07 ENCOUNTER — Ambulatory Visit (HOSPITAL_COMMUNITY)
Admission: EM | Admit: 2018-11-07 | Discharge: 2018-11-07 | Disposition: A | Discharge status: Home / Self Care | Payer: Medicaid Other | Attending: Family Medicine | Admitting: Family Medicine

## 2018-11-07 ENCOUNTER — Encounter (HOSPITAL_COMMUNITY): Payer: Self-pay | Admitting: Emergency Medicine

## 2018-11-07 DIAGNOSIS — J020 Streptococcal pharyngitis: Secondary | ICD-10-CM

## 2018-11-07 DIAGNOSIS — M545 Low back pain, unspecified: Secondary | ICD-10-CM

## 2018-11-07 DIAGNOSIS — J029 Acute pharyngitis, unspecified: Secondary | ICD-10-CM

## 2018-11-07 LAB — POCT RAPID STREP A: Streptococcus, Group A Screen (Direct): POSITIVE — AB

## 2018-11-07 MED ORDER — CYCLOBENZAPRINE HCL 5 MG PO TABS: 5.0000 mg | ORAL_TABLET | Freq: Two times a day (BID) | ORAL | 0 refills | Status: DC | PRN

## 2018-11-07 MED ORDER — CETIRIZINE HCL 10 MG PO CAPS: 10.0000 mg | ORAL_CAPSULE | Freq: Every day | ORAL | 0 refills | Status: DC

## 2018-11-07 MED ORDER — IBUPROFEN 800 MG PO TABS: 800.0000 mg | ORAL_TABLET | Freq: Three times a day (TID) | ORAL | 0 refills | Status: DC

## 2018-11-07 MED ORDER — AMOXICILLIN 500 MG PO CAPS: 500.0000 mg | ORAL_CAPSULE | Freq: Two times a day (BID) | ORAL | 0 refills | Status: DC

## 2018-11-07 MED ORDER — AMOXICILLIN 500 MG PO CAPS: 500.0000 mg | ORAL_CAPSULE | Freq: Two times a day (BID) | ORAL | 0 refills | Status: AC

## 2018-11-07 NOTE — Discharge Instructions (Addendum)
Sore Throat  Your rapid strep test was positive today. We will treat you for strep throat with an antibiotic. Please take Amoxicillin as prescribed.   Please continue Tylenol or Ibuprofen for fever and pain. May try salt water gargles, cepacol lozenges, throat spray, or OTC cold relief medicine for throat discomfort. If you also have congestion take a daily anti-histamine like Zyrtec, Claritin, and a oral decongestant to help with post nasal drip that may be irritating your throat.   Stay hydrated and drink plenty of fluids to keep your throat coated relieve irritation.   Back Pain Use anti-inflammatories for pain/swelling. You may take up to 800 mg Ibuprofen every 8 hours with food. You may supplement Ibuprofen with Tylenol [phone] mg every 8 hours.   You may use flexeril as needed to help with pain. This is a muscle relaxer and causes sedation- please use only at bedtime or when you will be home and not have to drive/work- begin with 1 tablet

## 2018-11-07 NOTE — ED Provider Notes (Signed)
[facility]    CSN: [id number] Arrival date & time: 11/07/18  1553     History   Chief Complaint Chief Complaint  Patient presents with  . Sore Throat    HPI [name] is a 28 y.o. 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  Patient states that over the past 2 days she has developed a sore throat.  Painful with swallowing and talking.  Denies associated congestion or cough.  Denies any fevers.  Denies using any inhalers or immunosuppressants.  Denies nausea vomiting or diarrhea. she is not taking medicines for symptoms, she took something at work that a coworker gave her, but she was unsure of what this was and did not have any relief from it.  She is also had back pain in her lower back on both sides.  Denies any injury, increase in activity or heavy lifting.  Denies radiation into legs.  Denies numbness or tingling.  Denies changes in urination or bowel movements.  States that her job is not strenuous.  She tried a topical cream with minimal relief.  HPI  Past Medical History:  Diagnosis Date  . Hepatitis    Hep B positive    Patient Active Problem List   Diagnosis Date Noted  . Auditory complaints of both ears 01/01/2018  . Indication for care in labor or delivery 11/07/2017  . Refugee health examination 07/24/2017  . Encounter for supervision of normal pregnancy 07/24/2017  . Wrist swelling, unspecified laterality 07/24/2017    Past Surgical History:  Procedure Laterality Date  . NO PAST SURGERIES      OB History    Gravida  2   Para  1   Term  1   Preterm      AB      Living  1     SAB      TAB      Ectopic      Multiple      Live Births               Home Medications    Prior to Admission medications   Medication Sig Start Date End Date Taking? Authorizing Provider  amoxicillin (AMOXIL) 500 MG capsule Take 1 capsule (500 mg  total) by mouth 2 (two) times daily for 10 days. 11/07/18 11/17/18  Jill Dayhoff C, PA-Huff  Cetirizine HCl 10 MG CAPS Take 1 capsule (10 mg total) by mouth daily for 10 days. 11/07/18 11/17/18  Jill Blanchet C, PA-Huff  cyclobenzaprine (FLEXERIL) 5 MG tablet Take 1-2 tablets (5-10 mg total) by mouth 2 (two) times daily as needed for muscle spasms. 11/07/18   Jill Schelling C, PA-Huff  ibuprofen (ADVIL,MOTRIN) 800 MG tablet Take 1 tablet (800 mg total) by mouth 3 (three) times daily. 11/07/18   Jill Ellwood C, PA-Huff  Prenatal Vit-Fe Fumarate-FA (PRENATAL MULTIVITAMIN) TABS tablet Take 1 tablet by mouth daily at 12 noon.    [provider]    Family History History reviewed. No pertinent family history.  Social History Social History   Tobacco Use  . Smoking status: Never Smoker  . Smokeless tobacco: Never Used  Substance Use Topics  . Alcohol use: No    Frequency: Never  . Drug use: No     Allergies   Patient has no known allergies.   Review of Systems Review of Systems  Constitutional: Negative for activity change, appetite change,  chills, fatigue and fever.  HENT: Positive for sore throat. Negative for congestion, ear pain, rhinorrhea, sinus pressure and trouble swallowing.   Eyes: Negative for discharge and redness.  Respiratory: Negative for cough, chest tightness and shortness of breath.   Cardiovascular: Negative for chest pain.  Gastrointestinal: Negative for abdominal pain, diarrhea, nausea and vomiting.  Genitourinary: Negative for decreased urine volume and difficulty urinating.  Musculoskeletal: Positive for back pain and myalgias.  Skin: Negative for rash.  Neurological: Negative for dizziness, light-headedness and headaches.     Physical Exam Triage Vital Signs ED Triage Vitals  Enc Vitals Group     BP 11/07/18 1659 109/71     Pulse Rate 11/07/18 1659 71     Resp 11/07/18 1659 20     Temp 11/07/18 1659 98.4 F (36.9 Huff)     Temp Source 11/07/18 1659  Temporal     SpO2 11/07/18 1659 100 %     Weight --      Height --      Head Circumference --      Peak Flow --      Pain Score 11/07/18 1700 8     Pain Loc --      Pain Edu? --      Excl. in GC? --    No data found.  Updated Vital Signs BP 109/71 (BP Location: Right Arm)   Pulse 71   Temp 98.4 F (36.9 Huff) (Temporal)   Resp 20   LMP 11/06/2018   SpO2 100%   Breastfeeding No   Visual Acuity Right Eye Distance:   Left Eye Distance:   Bilateral Distance:    Right Eye Near:   Left Eye Near:    Bilateral Near:     Physical Exam Vitals signs and nursing note reviewed.  Constitutional:      General: She is not in acute distress.    Appearance: She is well-developed.  HENT:     Head: Normocephalic and atraumatic.     Ears:     Comments: Bilateral ears without tenderness to palpation of external auricle, tragus and mastoid, EAC's without erythema or swelling, TM's with good bony landmarks and cone of light. Non erythematous.    Nose:     Comments: Bilateral turbinates swollen, dried blood bilaterally    Mouth/Throat:     Comments: Oral mucosa pink and moist, no tonsillar enlargement or exudate. Posterior pharynx patent and erythematous-visible draining mucus in posterior pharynx, no uvula deviation or swelling. Normal phonation. Eyes:     Conjunctiva/sclera: Conjunctivae normal.  Neck:     Musculoskeletal: Neck supple.  Cardiovascular:     Rate and Rhythm: Normal rate and regular rhythm.     Heart sounds: No murmur.  Pulmonary:     Effort: Pulmonary effort is normal. No respiratory distress.     Breath sounds: Normal breath sounds.  Abdominal:     Palpations: Abdomen is soft.     Tenderness: There is no abdominal tenderness.  Musculoskeletal:     Comments: Nontender to palpation of cervical focal, thoracic and lumbar spine midline, mild tenderness to bilateral lower hip strength 5/5 and equal bilaterally, knee strength 5/5 and equal bilaterally, patellar reflexes 2+  bilaterally  Ambulating independently without abnormality  Skin:    General: Skin is warm and dry.  Neurological:     Mental Status: She is alert.      UC Treatments / Results  Labs (all labs ordered are listed, but only abnormal results are  displayed) Labs Reviewed  POCT RAPID STREP A - Abnormal; Notable for the following components:      Result Value   Streptococcus, Group A Screen (Direct) POSITIVE (*)    All other components within normal limits    EKG None  Radiology No results found.  Procedures Procedures (including critical care time)  Medications Ordered in UC Medications - No data to display  Initial Impression / Assessment and Plan / UC Course  I have reviewed the triage vital signs and the nursing notes.  Pertinent labs & imaging results that were available during my care of the patient were reviewed by me and considered in my medical decision making (see chart for details).     Strep test positive, will treat for strep with amoxicillin twice daily x10 days.  Also provided cetirizine to help with any drainage and congestion that is contributing to throat irritation.  Back pain seems to be more muscular, strength intact distally, no red flags for cauda equina.  Will treat with anti-inflammatories as well as muscle relaxer.  Discussed drowsiness regarding Flexeril.  Continue to monitor symptoms,Discussed strict return precautions. Patient verbalized understanding and is agreeable with plan.  Final Clinical Impressions(s) / UC Diagnoses   Final diagnoses:  Sore throat  Acute bilateral low back pain without sciatica  Strep throat     Discharge Instructions     Sore Throat  Your rapid strep test was positive today. We will treat you for strep throat with an antibiotic. Please take Amoxicillin as prescribed.   Please continue Tylenol or Ibuprofen for fever and pain. May try salt water gargles, cepacol lozenges, throat spray, or OTC cold relief medicine  for throat discomfort. If you also have congestion take a daily anti-histamine like Zyrtec, Claritin, and a oral decongestant to help with post nasal drip that may be irritating your throat.   Stay hydrated and drink plenty of fluids to keep your throat coated relieve irritation.   Back Pain Use anti-inflammatories for pain/swelling. You may take up to 800 mg Ibuprofen every 8 hours with food. You may supplement Ibuprofen with Tylenol [phone] every 8 hours.   You may use flexeril as needed to help with pain. This is a muscle relaxer and causes sedation- please use only at bedtime or when you will be home and not have to drive/work- begin with 1 tablet    ED Prescriptions    Medication Sig Dispense Auth. Provider   ibuprofen (ADVIL,MOTRIN) 800 MG tablet  (Status: Discontinued) Take 1 tablet (800 mg total) by mouth 3 (three) times daily. 21 tablet Kristin Barcus C, PA-Huff   cyclobenzaprine (FLEXERIL) 5 MG tablet  (Status: Discontinued) Take 1-2 tablets (5-10 mg total) by mouth 2 (two) times daily as needed for muscle spasms. 24 tablet Lahna Nath C, PA-Huff   Cetirizine HCl 10 MG CAPS  (Status: Discontinued) Take 1 capsule (10 mg total) by mouth daily for 10 days. 10 capsule Billi Bright C, PA-Huff   amoxicillin (AMOXIL) 500 MG capsule  (Status: Discontinued) Take 1 capsule (500 mg total) by mouth 2 (two) times daily for 10 days. 20 capsule Rollins Wrightson C, PA-Huff   amoxicillin (AMOXIL) 500 MG capsule Take 1 capsule (500 mg total) by mouth 2 (two) times daily for 10 days. 20 capsule Samon Dishner C, PA-Huff   Cetirizine HCl 10 MG CAPS Take 1 capsule (10 mg total) by mouth daily for 10 days. 10 capsule Amenah Tucci C, PA-Huff   cyclobenzaprine (FLEXERIL) 5 MG tablet  Take 1-2 tablets (5-10 mg total) by mouth 2 (two) times daily as needed for muscle spasms. 24 tablet Latandra Loureiro C, PA-Huff   ibuprofen (ADVIL,MOTRIN) 800 MG tablet Take 1 tablet (800 mg total) by mouth 3 (three) times daily. 21  tablet Yovanni Frenette, [place] C, PA-Huff     Controlled Substance Prescriptions [place] Controlled Substance Registry consulted? Not Applicable   Lew Dawes, [place] 11/07/18 1814

## 2018-11-07 NOTE — ED Triage Notes (Signed)
PT C/O: sore throat x3 days and back pain... limited English... will need Swahili interpreter.   DENIES: f/v/n/d  TAKING MEDS: OTC pain med but not sure  A&O x4... NAD... Ambulatory

## 2018-11-08 ENCOUNTER — Ambulatory Visit
Admission: RE | Admit: 2018-11-08 | Discharge: 2018-11-08 | Disposition: A | Discharge status: Home / Self Care | Payer: Medicaid Other | Source: Ambulatory Visit | Attending: Nurse Practitioner | Admitting: Nurse Practitioner

## 2018-11-08 DIAGNOSIS — B181 Chronic viral hepatitis B without delta-agent: Secondary | ICD-10-CM

## 2018-11-22 DIAGNOSIS — B181 Chronic viral hepatitis B without delta-agent: Secondary | ICD-10-CM | POA: Diagnosis not present

## 2018-12-03 ENCOUNTER — Ambulatory Visit: Payer: Medicaid Other | Admitting: Family Medicine

## 2018-12-03 ENCOUNTER — Other Ambulatory Visit: Payer: Self-pay

## 2018-12-03 ENCOUNTER — Encounter: Payer: Self-pay | Admitting: Family Medicine

## 2018-12-03 VITALS — BP 102/70 | HR 78 | Temp 98.5°F | Ht 65.0 in | Wt 165.8 lb

## 2018-12-03 DIAGNOSIS — M25532 Pain in left wrist: Secondary | ICD-10-CM

## 2018-12-03 DIAGNOSIS — M25439 Effusion, unspecified wrist: Secondary | ICD-10-CM | POA: Diagnosis not present

## 2018-12-03 DIAGNOSIS — K029 Dental caries, unspecified: Secondary | ICD-10-CM | POA: Diagnosis not present

## 2018-12-03 MED ORDER — IBUPROFEN 800 MG PO TABS: 800.0000 mg | ORAL_TABLET | Freq: Three times a day (TID) | ORAL | 0 refills | Status: DC

## 2018-12-03 NOTE — Patient Instructions (Signed)
Go for the MRI of your wrist.  I will call you with the results.  In the meantime, take the Ibuprofen for pain.    We are checking labs today as well.

## 2018-12-03 NOTE — Progress Notes (Signed)
Subjective:    Jill Huff is a 28 y.o. female who presents to [facility] today for Left wrist pain:  1.  Left wrist pain:  Persists.  Has swelling over left styloid which has been present for years, but which both she and her husband say is growing.  Pain is also worsening.  I saw her first 2 years ago and noted it then.  Radiographs at that time revealed foreshortening of radius causing anterior angulation at wrist and ulnar styloid prominence.    Since last visit -- pain has continued when she is working.  However also has pain when not working.  Ibuprofen helps inconsistently.  TTP when she presses on the area as well.  Husband and patient both state this has worsened in past 2 years -- pain has worsened in past several months.  Went to [place] in 2005.  First really [place] prominence around [place]  She had some traditional healers attempt to help the swelling with what sounds to be phlebotomy -- cutting the prominence with razor blades.  That was in 2009 as well.  No other treatments until she came to [place] and started taking ibuprofen.    No falls.  No trauma to her wrist.  No night sweats.  No fevers or chills.    2.  Tooth pain:  Present for past several years.  Saw dentist when she first arrived in the [place].  She was told them she would need cavity repair but they would not work on her at that point as she was pregnant.  Hasn't contacted them since.  Tooth pain upper jaw both Right and Left sides when she eats or drinks.  No drainage.  No swelling.    Seen at Urgent Care last month for sore throat.  This has completely resolved.  No other sore throat or URI type symptoms.    She also mentioned some knee pain and mild low back pain when working, but we did not have time to go fully into this today and will discuss in more detail next visit.     ROS as above per HPI.    The following portions of the patient's history were reviewed and updated as appropriate: allergies, current  medications, past medical history, family and social history, and problem list. Patient is a nonsmoker.    PMH reviewed.  Past Medical History:  Diagnosis Date  . Hepatitis    Hep B positive   Past Surgical History:  Procedure Laterality Date  . NO PAST SURGERIES      Medications reviewed. Current Outpatient Medications  Medication Sig Dispense Refill  . Cetirizine HCl 10 MG CAPS Take 1 capsule (10 mg total) by mouth daily for 10 days. 10 capsule 0  . cyclobenzaprine (FLEXERIL) 5 MG tablet Take 1-2 tablets (5-10 mg total) by mouth 2 (two) times daily as needed for muscle spasms. 24 tablet 0  . ibuprofen (ADVIL,MOTRIN) 800 MG tablet Take 1 tablet (800 mg total) by mouth 3 (three) times daily. 21 tablet 0  . Prenatal Vit-Fe Fumarate-FA (PRENATAL MULTIVITAMIN) TABS tablet Take 1 tablet by mouth daily at 12 noon.     No current facility-administered medications for this visit.      Objective:   Physical Exam BP 102/70   Pulse 78   Temp 98.5 F (36.9 C) (Oral)   Ht 5\' 5"  (1.651 m)   Wt 165 lb 12.8 oz (75.2 kg)   LMP 11/06/2018   SpO2 98%  BMI 27.59 kg/m  Gen:  Alert, cooperative patient who appears stated age in no acute distress.  Vital signs reviewed. HEENT: EOMI,  MMM.  Multiple caries noted.  No fullness or swelling or abscess on exam. MSK:  TTP 3 cm x 3 cm mass over Left ulnar styloid.  Full range of motion of wrist.  No redness here.  No fluctuance -- the mass is hard with a bony consistency.  Skin:  Well healed scars noted over prominence. Psych:  Not depressed or anxious appearing.  Linear and coherent thought process as evidenced by speech pattern. Smiles spontaneously. Much more alert and conversant today than last visit with me.    No results found for this or any previous visit (from the past 72 hour(s)).

## 2018-12-04 LAB — CBC WITH DIFFERENTIAL/PLATELET
Basophils Absolute: 0 10*3/uL (ref 0.0–0.2)
Basos: 1 %
EOS (ABSOLUTE): 0.1 10*3/uL (ref 0.0–0.4)
EOS: 1 %
Hematocrit: 43.9 % (ref 34.0–46.6)
Hemoglobin: 14.1 g/dL (ref 11.1–15.9)
IMMATURE GRANS (ABS): 0 10*3/uL (ref 0.0–0.1)
IMMATURE GRANULOCYTES: 0 %
Lymphocytes Absolute: 2.7 10*3/uL (ref 0.7–3.1)
Lymphs: 47 %
MCH: 25 pg — ABNORMAL LOW (ref 26.6–33.0)
MCHC: 32.1 g/dL (ref 31.5–35.7)
MCV: 78 fL — ABNORMAL LOW (ref 79–97)
MONOS ABS: 0.4 10*3/uL (ref 0.1–0.9)
Monocytes: 7 %
NEUTROS PCT: 44 %
Neutrophils Absolute: 2.5 10*3/uL (ref 1.4–7.0)
Platelets: 187 10*3/uL (ref 150–450)
RBC: 5.65 x10E6/uL — AB (ref 3.77–5.28)
RDW: 13.5 % (ref 11.7–15.4)
WBC: 5.7 10*3/uL (ref 3.4–10.8)

## 2018-12-04 LAB — COMPREHENSIVE METABOLIC PANEL
ALT: 20 IU/L (ref 0–32)
AST: 18 IU/L (ref 0–40)
Albumin/Globulin Ratio: 1.5 (ref 1.2–2.2)
Albumin: 4.4 g/dL (ref 3.9–5.0)
Alkaline Phosphatase: 60 IU/L (ref 39–117)
BILIRUBIN TOTAL: 0.5 mg/dL (ref 0.0–1.2)
BUN / CREAT RATIO: 15 (ref 9–23)
BUN: 11 mg/dL (ref 6–20)
CALCIUM: 9.8 mg/dL (ref 8.7–10.2)
CHLORIDE: 105 mmol/L (ref 96–106)
CO2: 24 mmol/L (ref 20–29)
Creatinine, Ser: 0.73 mg/dL (ref 0.57–1.00)
GFR calc Af Amer: 131 mL/min/{1.73_m2} (ref >59)
GFR calc non Af Amer: 114 mL/min/{1.73_m2} (ref >59)
Globulin, Total: 2.9 g/dL (ref 1.5–4.5)
Glucose: 86 mg/dL (ref 65–99)
Potassium: 4.3 mmol/L (ref 3.5–5.2)
SODIUM: 139 mmol/L (ref 134–144)
TOTAL PROTEIN: 7.3 g/dL (ref 6.0–8.5)

## 2018-12-04 LAB — PHOSPHORUS: PHOSPHORUS: 3.6 mg/dL (ref 3.0–4.3)

## 2018-12-04 LAB — TSH: TSH: 0.864 u[IU]/mL (ref 0.450–4.500)

## 2018-12-05 ENCOUNTER — Encounter: Payer: Self-pay | Admitting: Family Medicine

## 2018-12-05 DIAGNOSIS — K029 Dental caries, unspecified: Secondary | ICD-10-CM | POA: Insufficient documentation

## 2018-12-05 HISTORY — DX: Dental caries, unspecified: K02.9

## 2018-12-05 NOTE — Assessment & Plan Note (Signed)
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  Painful and tender on exam. Checking basic labs including electrolytes (calcium) today.   Will call patient with MRI results.

## 2018-12-05 NOTE — Assessment & Plan Note (Signed)
Ibuprofen for pain relief To contact prior dentist for definitive treatment No evidence of abscess on exam today.

## 2018-12-07 ENCOUNTER — Other Ambulatory Visit: Payer: Medicaid Other

## 2019-01-13 ENCOUNTER — Ambulatory Visit (HOSPITAL_COMMUNITY)
Admission: EM | Admit: 2019-01-13 | Discharge: 2019-01-13 | Disposition: A | Discharge status: Home / Self Care | Payer: Medicaid Other | Attending: Family Medicine | Admitting: Family Medicine

## 2019-01-13 ENCOUNTER — Other Ambulatory Visit: Payer: Self-pay

## 2019-01-13 ENCOUNTER — Encounter (HOSPITAL_COMMUNITY): Payer: Self-pay

## 2019-01-13 DIAGNOSIS — J3489 Other specified disorders of nose and nasal sinuses: Secondary | ICD-10-CM

## 2019-01-13 DIAGNOSIS — B349 Viral infection, unspecified: Secondary | ICD-10-CM

## 2019-01-13 DIAGNOSIS — R519 Headache, unspecified: Secondary | ICD-10-CM

## 2019-01-13 DIAGNOSIS — R51 Headache: Secondary | ICD-10-CM

## 2019-01-13 MED ORDER — FLUTICASONE PROPIONATE 50 MCG/ACT NA SUSP: 2.0000 | Freq: Every day | NASAL | 0 refills | Status: DC

## 2019-01-13 MED ORDER — CETIRIZINE HCL 10 MG PO CAPS: 10.0000 mg | ORAL_CAPSULE | Freq: Every day | ORAL | 0 refills | Status: DC

## 2019-01-13 MED ORDER — ACETAMINOPHEN ER 650 MG PO TBCR: 650.0000 mg | EXTENDED_RELEASE_TABLET | Freq: Three times a day (TID) | ORAL | 0 refills | Status: DC | PRN

## 2019-01-13 NOTE — Discharge Instructions (Signed)
As discussed, cannot rule out COVID. Currently, no alarming signs. You can take tylenol for the headache. Zyrtec and flonase nasal spray for the runny nose/stuffy nose. I would like you to quarantine for 7 days since symptoms onset AND >72 hours without fever, and improved respiratory symptoms. Continue to monitor, you can call COVID hotline [phone]) or use [facility] online if symptoms worsens to determine where you should seek care. If experiencing shortness of breath, trouble breathing, call 911 and provide them with your current situation.

## 2019-01-13 NOTE — ED Provider Notes (Signed)
[facility]    CSN: [id number] Arrival date & time: 01/13/19  1203     History   Chief Complaint Chief Complaint  Patient presents with  . Generalized Body Aches    HPI Jill Huff is a 28 y.o. female.   28 year old female comes in for 10 day history of URI symptoms. HPI obtained by patient through video translator. She has had headaches, decreased appetites, body aches, loss of smell and taste. She at first denies congestion/rhinorrhea, but then states she will have some at home and believes it is what causes the loss of smell/taste. Denies cough, shortness of breath. Denies fever, chills. She states he has body aches States has been sweating some nights. Both her children are sick, but with different symptoms. No other sick contact. She works at [employer], denies known Clinical research associateCOVID contact.      Past Medical History:  Diagnosis Date  . Hepatitis    Hep B positive    Patient Active Problem List   Diagnosis Date Noted  . Caries 12/05/2018  . Auditory complaints of both ears 01/01/2018  . Indication for care in labor or delivery 11/07/2017  . Refugee health examination 07/24/2017  . Encounter for supervision of normal pregnancy 07/24/2017  . Wrist swelling, unspecified laterality 07/24/2017    Past Surgical History:  Procedure Laterality Date  . NO PAST SURGERIES      OB History    Gravida  2   Para  1   Term  1   Preterm      AB      Living  1     SAB      TAB      Ectopic      Multiple      Live Births               Home Medications    Prior to Admission medications   Medication Sig Start Date End Date Taking? Authorizing Provider  acetaminophen (TYLENOL 8 HOUR) 650 MG CR tablet Take 1 tablet (650 mg total) by mouth every 8 (eight) hours as needed for pain. 01/13/19   Cathie HoopsYu,  V, PA-C  Cetirizine HCl 10 MG CAPS Take 1 capsule (10 mg total) by mouth daily. 01/13/19   Cathie HoopsYu,  V, PA-C  fluticasone (FLONASE) 50 MCG/ACT nasal spray Place 2  sprays into both nostrils daily. 01/13/19   Cathie HoopsYu,  V, PA-C  ibuprofen (ADVIL,MOTRIN) 800 MG tablet Take 1 tablet (800 mg total) by mouth 3 (three) times daily. 12/03/18   Tobey GrimWalden, Jeffrey H, MD    Family History History reviewed. No pertinent family history.  Social History Social History   Tobacco Use  . Smoking status: Never Smoker  . Smokeless tobacco: Never Used  Substance Use Topics  . Alcohol use: No    Frequency: Never  . Drug use: No     Allergies   Patient has no known allergies.   Review of Systems Review of Systems  Reason unable to perform ROS: See HPI as above.     Physical Exam Triage Vital Signs ED Triage Vitals  Enc Vitals Group     BP 01/13/19 1248 111/75     Pulse Rate 01/13/19 1247 86     Resp 01/13/19 1247 20     Temp 01/13/19 1247 98.7 F (37.1 C)     Temp src --      SpO2 01/13/19 1247 98 %     Weight --  Height --      Head Circumference --      Peak Flow --      Pain Score --      Pain Loc --      Pain Edu? --      Excl. in GC? --    No data found.  Updated Vital Signs BP 111/75   Pulse 86   Temp 98.7 F (37.1 C)   Resp 20   SpO2 98%   Physical Exam Constitutional:      General: She is not in acute distress.    Appearance: Normal appearance. She is not ill-appearing, toxic-appearing or diaphoretic.  HENT:     Head: Normocephalic and atraumatic.     Mouth/Throat:     Mouth: Mucous membranes are moist.     Pharynx: Oropharynx is clear. Uvula midline.  Neck:     Musculoskeletal: Normal range of motion and neck supple.  Cardiovascular:     Rate and Rhythm: Normal rate and regular rhythm.     Heart sounds: Normal heart sounds. No murmur. No friction rub. No gallop.   Pulmonary:     Effort: Pulmonary effort is normal. No accessory muscle usage, prolonged expiration, respiratory distress or retractions.     Comments: Lungs clear to auscultation without adventitious lung sounds. Neurological:     General: No focal deficit  present.     Mental Status: She is alert and oriented to person, place, and time.    UC Treatments / Results  Labs (all labs ordered are listed, but only abnormal results are displayed) Labs Reviewed - No data to display  EKG None  Radiology No results found.  Procedures Procedures (including critical care time)  Medications Ordered in UC Medications - No data to display  Initial Impression / Assessment and Plan / UC Course  I have reviewed the triage vital signs and the nursing notes.  Pertinent labs & imaging results that were available during my care of the patient were reviewed by me and considered in my medical decision making (see chart for details).    Patient speaking in full sentences without respiratory distress. Afebrile without antipyretic in last 8 hours. No tachycardia, tachypnea. Lungs CTAB. COVID testing limited to inpatient. Given history and exam, will have patient self quarantine. Instructions on when to end quarantine, family member isolation discussed, and resources provided. Symptomatic treatment discussed. Return precautions given. Patient expresses understanding and agrees to plan.  Final Clinical Impressions(s) / UC Diagnoses   Final diagnoses:  Viral illness  Rhinorrhea  Acute intractable headache, unspecified headache type    ED Prescriptions    Medication Sig Dispense Auth. Provider   acetaminophen (TYLENOL 8 HOUR) 650 MG CR tablet Take 1 tablet (650 mg total) by mouth every 8 (eight) hours as needed for pain. 30 tablet ,  V, PA-C   fluticasone (FLONASE) 50 MCG/ACT nasal spray Place 2 sprays into both nostrils daily. 1 g ,  V, PA-C   Cetirizine HCl 10 MG CAPS Take 1 capsule (10 mg total) by mouth daily. 15 capsule [place]re[place] [employer], [place] 01/13/19 1411

## 2019-01-13 NOTE — ED Triage Notes (Addendum)
Patient presents to Urgent Care with complaints of generalized body aches and headache since 10 days ago. Patient reports she has not tried any home remedies, swahili interpreter used during triage. Pt states she has not felt feverish, but has night sweats.

## 2019-06-24 DIAGNOSIS — B181 Chronic viral hepatitis B without delta-agent: Secondary | ICD-10-CM | POA: Diagnosis not present

## 2019-06-30 ENCOUNTER — Ambulatory Visit: Payer: Medicaid Other | Admitting: Family Medicine

## 2019-06-30 ENCOUNTER — Other Ambulatory Visit: Payer: Self-pay

## 2019-06-30 VITALS — BP 100/60 | HR 65 | Wt 179.6 lb

## 2019-06-30 DIAGNOSIS — M25531 Pain in right wrist: Secondary | ICD-10-CM | POA: Diagnosis not present

## 2019-06-30 DIAGNOSIS — M25439 Effusion, unspecified wrist: Secondary | ICD-10-CM

## 2019-06-30 DIAGNOSIS — M25532 Pain in left wrist: Secondary | ICD-10-CM | POA: Diagnosis not present

## 2019-06-30 NOTE — Progress Notes (Signed)
  Subjective  CC: Wrist pain Jill Huff is a 28 y.o. female who presents today with the following problems: Wrist Pain  Previously seen by Dr. Mingo Amber in March 2020. Patient did not go for wrist MRI as ordered ($?). Today, both wrists hurt, but it is still mostly the left wrist that is bothering her. Thinks its because of the work that she does. She works at [employer] and uses her hands all day. Sometimes it hurts in the mornings, but it will always hurt after work.  She denies any swelling or redness of either wrist when she has pain.  She does report that there is a dull ache and pain specifically with palpation after work.  She denies any other pain in any other joints.  Pertinent P/F/SHx: Left-sided wrist swelling (2018 and 2020) ROS: Pertinent ROS included in HPI. Objective  Physical Exam:  BP 100/60   Pulse 65   Wt 179 lb 9.6 oz (81.5 kg)   SpO2 100%   BMI 29.89 kg/m  General: Well-appearing female, no acute distress MSK: Left styloid process is enlarged (pictures in media tab, however, did not truly capture size of styloid process) full active and passive range of motion of bilateral wrists.  Normal supination pronation.  When flexed fully, patient has pinpoint pain at dorsal surface of styloid processes.  This also occurs when patients wrists are fully extended.  There is no pain with palpation at neutral position.  There is no swelling, erythema, crepitus.  Pulses are good.  Sensation and motor within normal limits.  Denies any history of trauma or fracture to the wrists.  Assessment & Plan    Problem List Items Addressed This Visit      Other   Wrist swelling, unspecified laterality    Patient was recently seen by Dr. Mingo Amber in March 2020.  At that time, she was ordered MRI wrist of left hand to rule out osteosarcoma.  Patient does report that she has continued margining of her left styloid process and it continues to be painful with work.  Patient also reports that her right  wrist is also been painful with work.  There are no significant physical exam findings except for deformity of left styloid process.  Will obtain MRI and schedule patient for MRI appointment given language barrier.  On previous x-ray in 2018, described to be a developmental etiology.  Pain could be due to tendinitis given repetitive movements at work, however, patient denies any swelling or redness of area after work.  Still concern for osteosarcoma, especially as patient has noted that it has continued to become larger. Case discussed with Dr. Owens Shark -Follow-up MRI       Other Visit Diagnoses    Left wrist pain    -  Primary   Relevant Orders   Uric Acid   Vitamin D, 25-hydroxy   Comprehensive metabolic panel   Pain in both wrists       Relevant Orders   MR WRIST LEFT WO CONTRAST     [name], M.D.  PGY-2  Family Medicine  [phone] 06/30/2019 11:27 AM

## 2019-06-30 NOTE — Assessment & Plan Note (Signed)
Patient was recently seen by Dr. Mingo Amber in March 2020.  At that time, she was ordered MRI wrist of left hand to rule out osteosarcoma.  Patient does report that she has continued margining of her left styloid process and it continues to be painful with work.  Patient also reports that her right wrist is also been painful with work.  There are no significant physical exam findings except for deformity of left styloid process.  Will obtain MRI and schedule patient for MRI appointment given language barrier.  On previous x-ray in 2018, described to be a developmental etiology.  Pain could be due to tendinitis given repetitive movements at work, however, patient denies any swelling or redness of area after work.  Still concern for osteosarcoma, especially as patient has noted that it has continued to become larger. Case discussed with Dr. Owens Shark -Follow-up MRI

## 2019-07-01 LAB — COMPREHENSIVE METABOLIC PANEL
ALT: 20 IU/L (ref 0–32)
AST: 21 IU/L (ref 0–40)
Albumin/Globulin Ratio: 1.4 (ref 1.2–2.2)
Albumin: 4.1 g/dL (ref 3.9–5.0)
Alkaline Phosphatase: 76 IU/L (ref 39–117)
BUN/Creatinine Ratio: 10 (ref 9–23)
BUN: 7 mg/dL (ref 6–20)
Bilirubin Total: 0.3 mg/dL (ref 0.0–1.2)
CO2: 22 mmol/L (ref 20–29)
Calcium: 9.4 mg/dL (ref 8.7–10.2)
Chloride: 105 mmol/L (ref 96–106)
Creatinine, Ser: 0.71 mg/dL (ref 0.57–1.00)
GFR calc Af Amer: 136 mL/min/{1.73_m2} (ref >59)
GFR calc non Af Amer: 118 mL/min/{1.73_m2} (ref >59)
Globulin, Total: 2.9 g/dL (ref 1.5–4.5)
Glucose: 94 mg/dL (ref 65–99)
Potassium: 4.1 mmol/L (ref 3.5–5.2)
Sodium: 138 mmol/L (ref 134–144)
Total Protein: 7 g/dL (ref 6.0–8.5)

## 2019-07-01 LAB — URIC ACID: Uric Acid: 4.2 mg/dL (ref 2.5–7.1)

## 2019-07-01 LAB — VITAMIN D 25 HYDROXY (VIT D DEFICIENCY, FRACTURES): Vit D, 25-Hydroxy: 20.1 ng/mL — ABNORMAL LOW (ref 30.0–100.0)

## 2019-07-04 ENCOUNTER — Ambulatory Visit: Payer: Medicaid Other | Admitting: Family Medicine

## 2019-07-10 ENCOUNTER — Ambulatory Visit (HOSPITAL_COMMUNITY): Payer: Medicaid Other

## 2019-07-10 ENCOUNTER — Other Ambulatory Visit: Payer: Self-pay

## 2019-07-10 ENCOUNTER — Ambulatory Visit (HOSPITAL_COMMUNITY)
Admission: RE | Admit: 2019-07-10 | Discharge: 2019-07-10 | Disposition: A | Discharge status: Home / Self Care | Payer: Medicaid Other | Source: Ambulatory Visit | Attending: Family Medicine | Admitting: Family Medicine

## 2019-07-10 DIAGNOSIS — M67432 Ganglion, left wrist: Secondary | ICD-10-CM | POA: Diagnosis not present

## 2019-07-10 DIAGNOSIS — M25532 Pain in left wrist: Secondary | ICD-10-CM | POA: Insufficient documentation

## 2019-07-10 DIAGNOSIS — M25531 Pain in right wrist: Secondary | ICD-10-CM | POA: Insufficient documentation

## 2020-01-14 IMAGING — MR MR WRIST*L* W/O CM
4 of 6 series · 18 of 40 positions shown · non-contrast
Comparison: X-ray 07/24/2017

CLINICAL DATA: Chronic wrist pain, deformity

EXAM:
MR OF THE LEFT WRIST WITHOUT CONTRAST
TECHNIQUE: Multiplanar, multisequence MR imaging of the left wrist was
performed. No intravenous contrast was administered.

[Series 4: T2 fat-sat · axial · 3.0mm · 0.20mm/px · z∈[-43,+10]mm · 3 of 24 slices shown (1 of 2)]
[im 4/24]
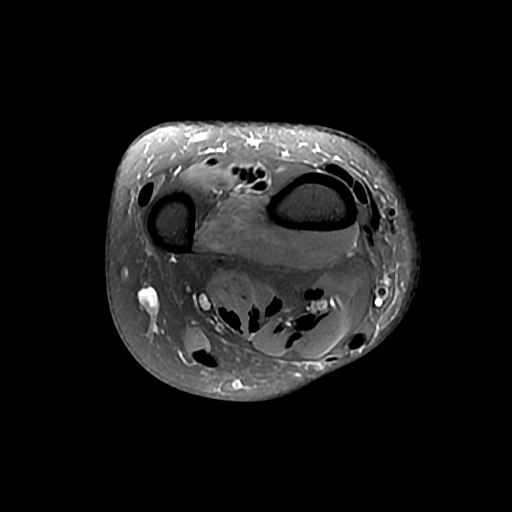
[im 12/24]
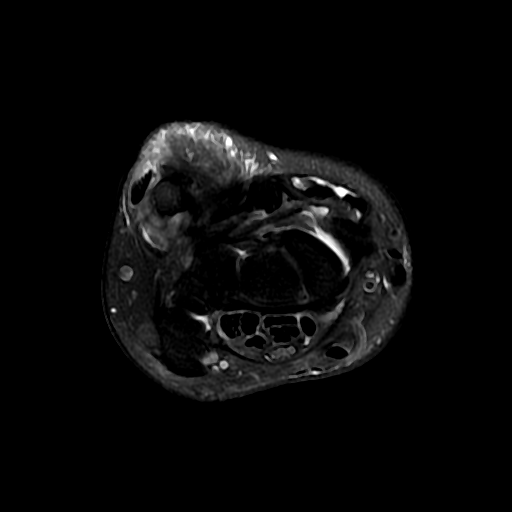
[im 20/24]
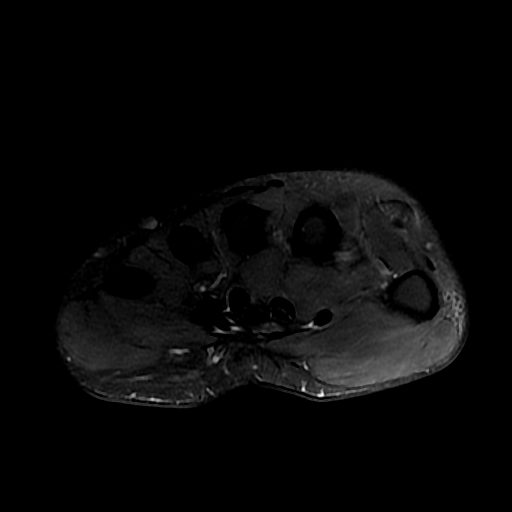

[Series 6: PD fat-sat · coronal · 3.0mm · 0.20mm/px · 6 of 24 slices shown (1 of 2)]
[im 1/24]
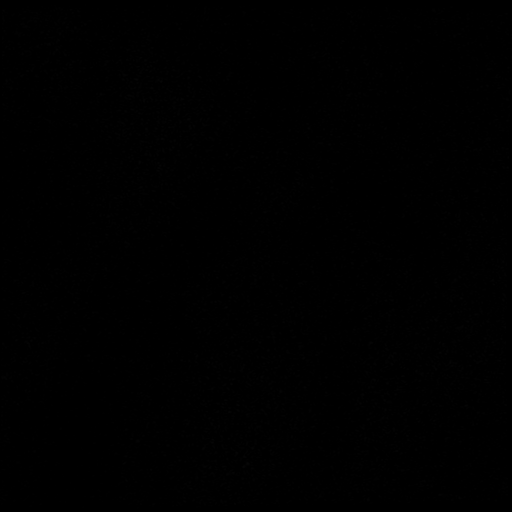
[im 5/24]
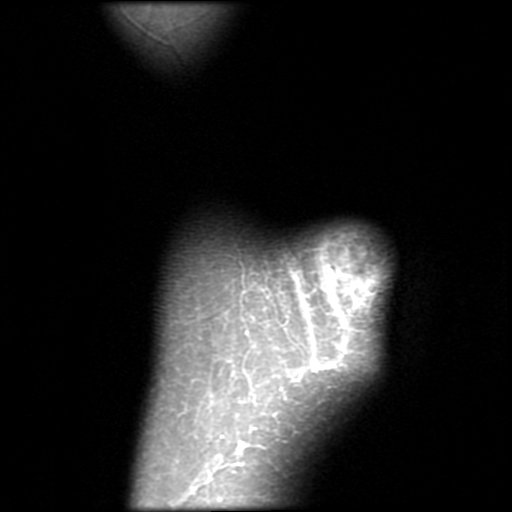
[im 10/24]
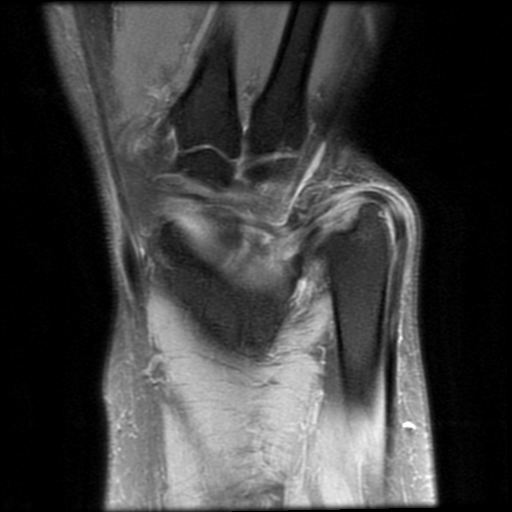
[im 14/24]
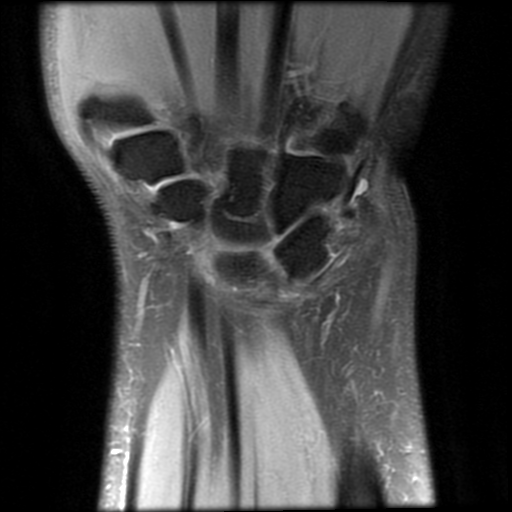
[im 19/24]
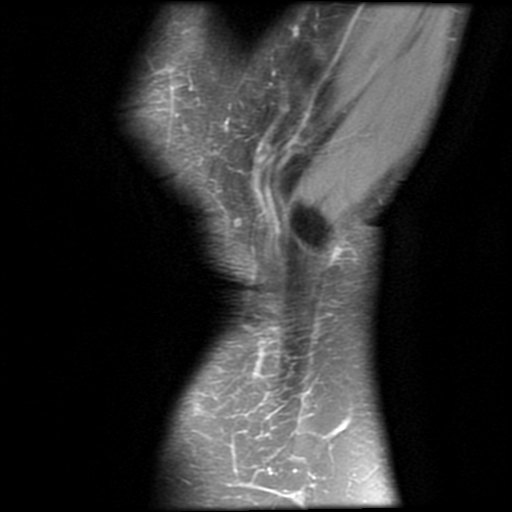
[im 24/24]
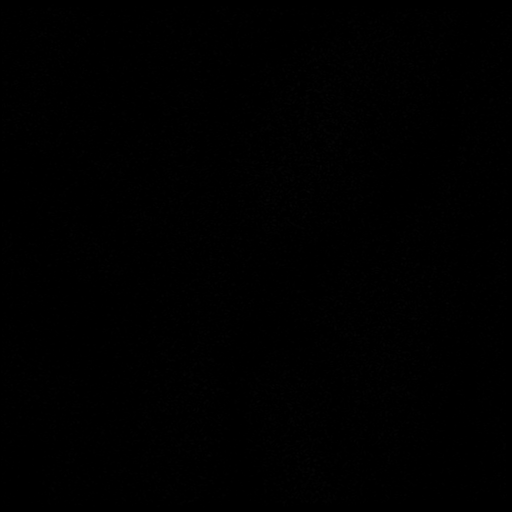

[Series 7: T2 fat-sat · coronal · 3.0mm · 0.20mm/px · 3 of 24 slices shown (2 of 2)]
[im 5/24]
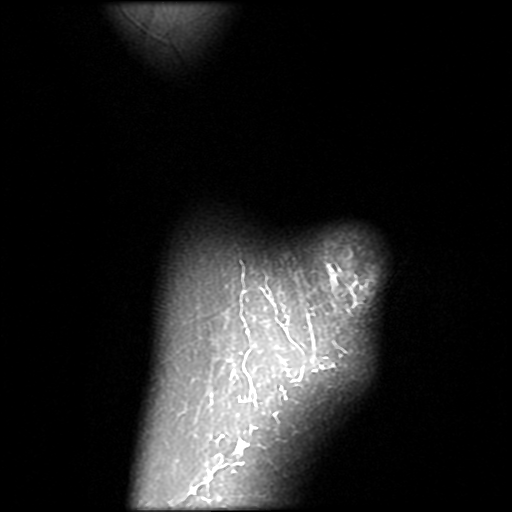
[im 14/24]
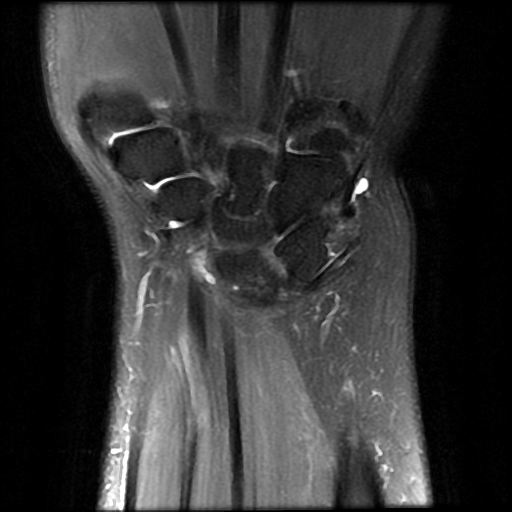
[im 24/24]
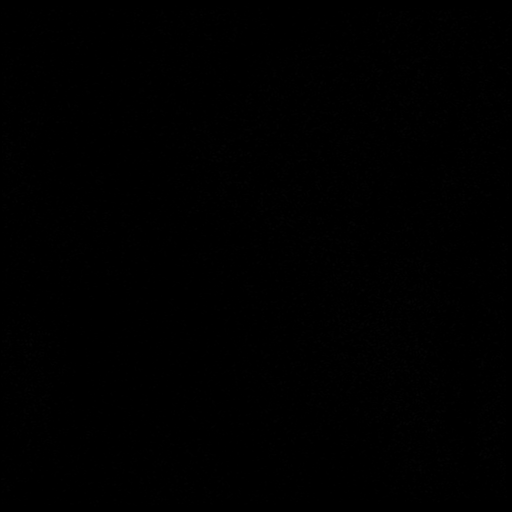

[Series 8: PD fat-sat · sagittal · 2.5mm · 0.20mm/px · 6 of 28 slices shown (2 of 2)]
[im 1/28]
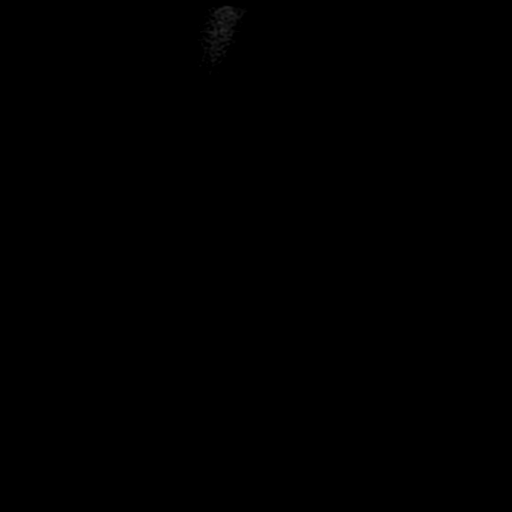
[im 4/28]
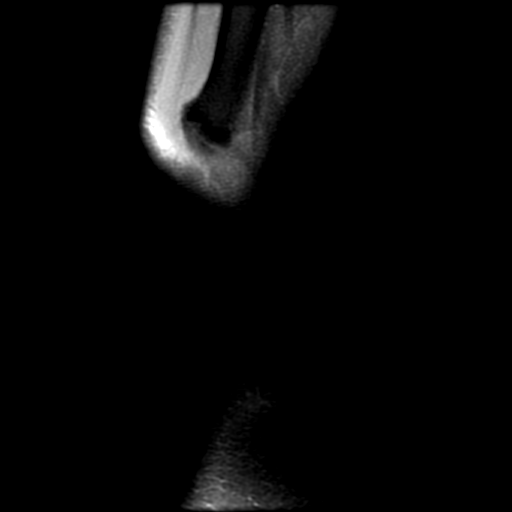
[im 8/28]
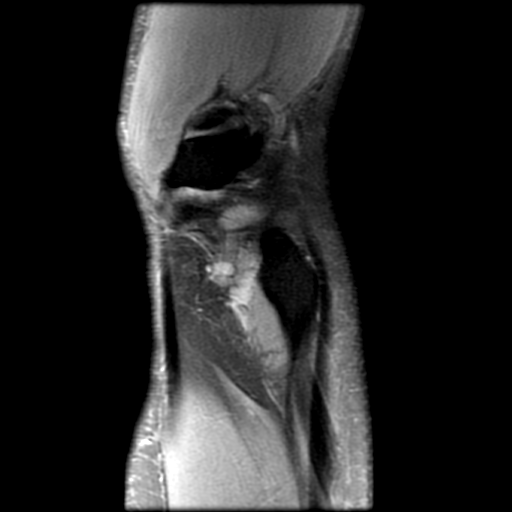
[im 12/28]
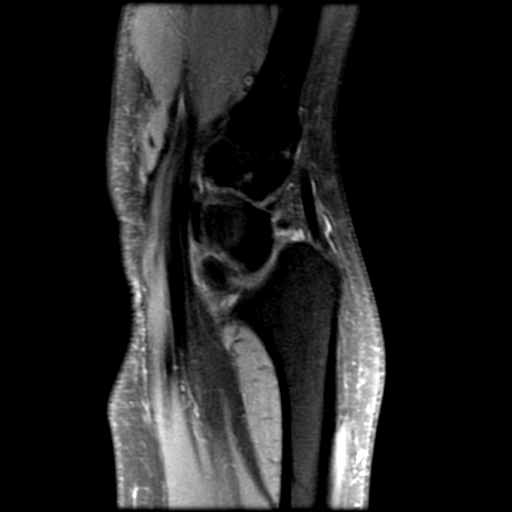
[im 16/28]
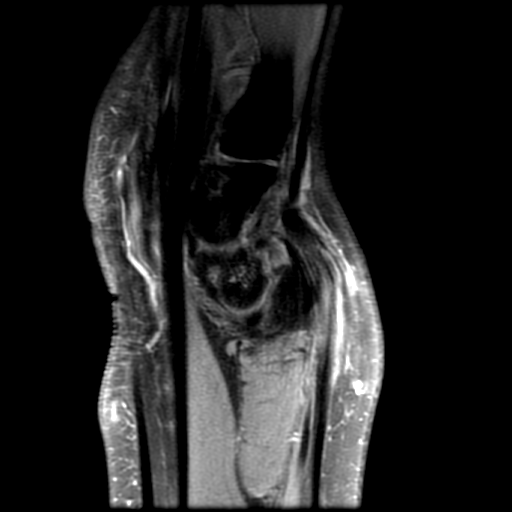
[im 24/28]
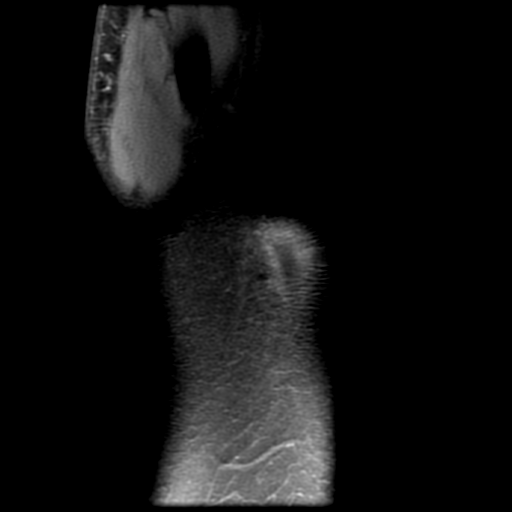

[18 of 40 positions shown; findings below may reference images not displayed]

FINDINGS: Ligaments: Scapholunate and lunotriquetral ligaments are grossly
intact.

Triangular fibrocartilage: TFC is thickened and intermediate in
signal, however remains intact without obvious tear.

Tendons: Focal tendinosis of the extensor carpi ulnaris tendon as it
courses around the dorsally subluxed ulnar styloid (series 6, image
10). More proximally within the ECU tendon there is a interstitial
tear at the level of the distal ulnar metaphysis (series 4, image
19) which appears to propagate distally. Trace tenosynovial fluid
within extensor compartment 4. Flexor tendons appear intact and
unremarkable.

Carpal tunnel/median nerve: Normal carpal tunnel. Normal median
nerve.

Guyon's canal: Normal.

Joint/cartilage: Multiloculated ganglion cyst emanates from the
ventral aspect of the radiocarpal joint, likely originating from the
radiolunate articulation (series 7, images 12-14) measuring up to
1.1 x 0.7 cm. Evaluation of the cartilage is slightly limited by
altered imaging planes secondary to wrist deformity.

Bones/carpal alignment: Madelung deformity of the wrist
characterized by exaggerated palmar and ulnar tilt of the
radiocarpal articulation, V-shaped proximal carpal row with wedging
of the carpus, and positive ulnar variance with dorsal subluxation
of the distal radioulnar joint. No fracture. No bone lesion.
Degenerative subchondral cystic change within the dorsal lunate.

Other: None.
IMPRESSION: 1. No acute osseous abnormality or focal bone lesion, as clinically
questioned.
2. Madelung deformity of the left wrist, as described above.
3. Focal tendinosis of the extensor carpi ulnaris tendon as it
courses around the dorsally subluxed ulnar styloid with more
proximal interstitial tear.
4. Multiloculated ganglion cyst emanates from the ventral aspect of
the radiocarpal joint, likely originating from the radiolunate
articulation.
5. Trace extensor compartment 4 tenosynovitis.

## 2020-03-11 ENCOUNTER — Ambulatory Visit: Payer: Medicaid Other | Admitting: Family Medicine

## 2020-03-18 DIAGNOSIS — B180 Chronic viral hepatitis B with delta-agent: Secondary | ICD-10-CM | POA: Diagnosis not present

## 2020-03-18 DIAGNOSIS — B170 Acute delta-(super) infection of hepatitis B carrier: Secondary | ICD-10-CM | POA: Diagnosis not present

## 2020-03-18 DIAGNOSIS — B181 Chronic viral hepatitis B without delta-agent: Secondary | ICD-10-CM | POA: Diagnosis not present

## 2020-03-18 DIAGNOSIS — R1011 Right upper quadrant pain: Secondary | ICD-10-CM | POA: Diagnosis not present

## 2020-03-19 ENCOUNTER — Other Ambulatory Visit: Payer: Self-pay | Admitting: Nurse Practitioner

## 2020-03-19 DIAGNOSIS — R1011 Right upper quadrant pain: Secondary | ICD-10-CM

## 2020-04-01 ENCOUNTER — Encounter: Payer: Self-pay | Admitting: Family Medicine

## 2020-04-01 ENCOUNTER — Ambulatory Visit: Payer: Medicaid Other | Admitting: Family Medicine

## 2020-04-01 ENCOUNTER — Other Ambulatory Visit: Payer: Self-pay

## 2020-04-01 VITALS — BP 110/60 | HR 70 | Ht 65.0 in | Wt 168.0 lb

## 2020-04-01 DIAGNOSIS — M25531 Pain in right wrist: Secondary | ICD-10-CM | POA: Diagnosis not present

## 2020-04-01 DIAGNOSIS — Q740 Other congenital malformations of upper limb(s), including shoulder girdle: Secondary | ICD-10-CM

## 2020-04-01 DIAGNOSIS — G8929 Other chronic pain: Secondary | ICD-10-CM | POA: Diagnosis not present

## 2020-04-01 DIAGNOSIS — M6790 Unspecified disorder of synovium and tendon, unspecified site: Secondary | ICD-10-CM | POA: Diagnosis not present

## 2020-04-01 DIAGNOSIS — M25532 Pain in left wrist: Secondary | ICD-10-CM

## 2020-04-01 DIAGNOSIS — M19232 Secondary osteoarthritis, left wrist: Secondary | ICD-10-CM

## 2020-04-01 DIAGNOSIS — M67432 Ganglion, left wrist: Secondary | ICD-10-CM

## 2020-04-01 NOTE — Assessment & Plan Note (Addendum)
Established problem Uncontrolled pain that is interfering with work capacity.  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   She had maintained working at this job for several years with complaint of wrist pain.    Referral ordered for hand surgery evaluation and recommendations by Dr Wendall Papa (Ortho-Hand, Wrist, and Elbow, at [facility]) per recommendation of Dr Merlyn Lot (Hand Ortho, local).    There may need to be an assessment by Vocational Rehabilitation for recommendation of reasonable accommodations at work for patient.   The patient has difficulty with transportation to appointments. Will see if there are resources for transportation.  She requires a Swahili interpreter for doctor visits.   Referral to Chronic Care Management to assist patient with arranging transportation to [facility] and navigating steps of a successful initial consultation at a distant, tertiary healthcare facility.

## 2020-04-01 NOTE — Progress Notes (Addendum)
Jill Huff is alone Sources of clinical information for visit is/are patient. Nursing assessment for this office visit was reviewed with the patient for accuracy and revision.     Previous Report(s) Reviewed: office notes and radiology reports  Depression screen [facility] 2/9 04/01/2020  Decreased Interest 0  Down, Depressed, Hopeless 0  PHQ - 2 Score 0  Altered sleeping 0  Tired, decreased energy 0  Change in appetite 0  Feeling bad or failure about yourself  0  Trouble concentrating 0  Moving slowly or fidgety/restless 0  Suicidal thoughts 0  PHQ-9 Score 0  Difficult doing work/chores Not difficult at all    Fall Risk  06/30/2019 12/03/2018 01/01/2018  Falls in the past year? 0 0 No  Number falls in past yr: 0 0 -  Injury with Fall? - 0 -  Follow up Falls evaluation completed Falls evaluation completed -    PHQ9 SCORE ONLY 04/01/2020 06/30/2019 12/03/2018  PHQ-9 Total Score 0 0 0    Adult vaccines due  Topic Date Due  . TETANUS/TDAP  Never done    Health Maintenance Due  Topic Date Due  . Hepatitis C Screening  Never done  . TETANUS/TDAP  Never done  . PAP-Cervical Cytology Screening  Never done  . PAP SMEAR-Modifier  Never done      History/P.E. limitations: communication barrier Language (Swahili).  Swahili Runner, broadcasting/film/video Alysia Penna) used for visit.   Adult vaccines due  Topic Date Due  . TETANUS/TDAP  Never done   There are no preventive care reminders to display for this patient.  Health Maintenance Due  Topic Date Due  . Hepatitis C Screening  Never done  . TETANUS/TDAP  Never done  . PAP-Cervical Cytology Screening  Never done  . PAP SMEAR-Modifier  Never done     Chief Complaint  Patient presents with  . Wrist Pain    HPI - Long-standing problem - Onset of change in shape of wrists around age 29 years old.  First noted when Jill Huff was in a refugee camp.  The wrists changed its shape slowly over time to where it had the large hard bump at the back  of the wrist.  - - The bilateral wrist pain began several years ago with left wrist more painful than the right.   - She was initially evaluated at [facility] in 12/2017 for bilateral wrist pain. She denied any history of acute trauma to her wrists. Bilateral wrist X-rays with foreshortening of the radius bilaterally with anterior angulation, left greater than right. The ov note states that a hand surgery referral was made, but there is nothing corroborating this in the patient's chart.   - Patient was re-evaluated 12/03/18 at [facility] 11/2018 for bilateral wrist pain.  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 The wrist pain responded inconsistently to APAP or NSAIDs. An MRI of wrist that was to be ordered at that visit did not occur.  There was a question if this was do to its cost.    - Patient presented again with bilateral wrist pain at [facility] 06/2019. 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 The MRI of left wrist was obtained that showed bowing of the radius with subluxation of the radioulnar joint dorsally, a multinucleated ganglion cyst at radiocarpal joint, extensor tendonopathy and degenerative lunate.  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    There was no record of referral of  the patient to any orthopedist, hand or general after this MRI.    LEFT WRIST   RIGHT WRIST  Visit Problem List with A/P  Skeletal dysplasia of radius, bilaterally Established problem Uncontrolled pain that is interfering with work capacity.  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   She had maintained working at this job for several years with complaint of wrist pain.    Plan is referral to for hand surgery evaluation and recommendations.  Referral made to [facility].   The patient has difficulty with transportation to appointments. Will see if there are  resources for transportation.  She requires a Swahili interpreter at [employer] visits.

## 2020-04-01 NOTE — Patient Instructions (Addendum)
Dr Deaunna Olarte believes a bone in your both your arms that did not grow as long as it should.  This made the normal bone in the arm pop up to make a bump at your wrist.   This problem can cause wrist pain, make it hurt when you use your hands, and limit how much work you can do with your hands.   We are arranging a visit with the Hand Surgeon at the [facility] located at [address] [place].  The Hand Center's Telephone number is [phone].

## 2020-04-06 ENCOUNTER — Encounter: Payer: Self-pay | Admitting: Family Medicine

## 2020-04-06 DIAGNOSIS — M19032 Primary osteoarthritis, left wrist: Secondary | ICD-10-CM | POA: Insufficient documentation

## 2020-04-06 DIAGNOSIS — M6790 Unspecified disorder of synovium and tendon, unspecified site: Secondary | ICD-10-CM | POA: Insufficient documentation

## 2020-04-06 DIAGNOSIS — M67432 Ganglion, left wrist: Secondary | ICD-10-CM | POA: Insufficient documentation

## 2020-04-06 NOTE — Addendum Note (Signed)
Addended by: Acquanetta Belling D on: 04/06/2020 09:58 AM   Modules accepted: Orders

## 2020-04-08 ENCOUNTER — Ambulatory Visit (HOSPITAL_COMMUNITY)
Admission: EM | Admit: 2020-04-08 | Discharge: 2020-04-08 | Disposition: A | Discharge status: Home / Self Care | Payer: Medicaid Other | Attending: Emergency Medicine | Admitting: Emergency Medicine

## 2020-04-08 ENCOUNTER — Encounter (HOSPITAL_COMMUNITY): Payer: Self-pay

## 2020-04-08 ENCOUNTER — Other Ambulatory Visit: Payer: Self-pay

## 2020-04-08 DIAGNOSIS — Z3201 Encounter for pregnancy test, result positive: Secondary | ICD-10-CM | POA: Diagnosis not present

## 2020-04-08 DIAGNOSIS — R1030 Lower abdominal pain, unspecified: Secondary | ICD-10-CM | POA: Diagnosis not present

## 2020-04-08 LAB — POCT URINALYSIS DIP (DEVICE)
Bilirubin Urine: NEGATIVE
Glucose, UA: NEGATIVE mg/dL
Hgb urine dipstick: NEGATIVE
Ketones, ur: NEGATIVE mg/dL
Leukocytes,Ua: NEGATIVE
Nitrite: NEGATIVE
Protein, ur: NEGATIVE mg/dL
Specific Gravity, Urine: <=1.005 (ref 1.005–1.030)
Urobilinogen, UA: 0.2 mg/dL (ref 0.0–1.0)
pH: 6 (ref 5.0–8.0)

## 2020-04-08 LAB — POC URINE PREG, ED: Preg Test, Ur: POSITIVE — AB

## 2020-04-08 MED ORDER — DOXYLAMINE-PYRIDOXINE 10-10 MG PO TBEC: 2.0000 | DELAYED_RELEASE_TABLET | Freq: Every day | ORAL | 0 refills | Status: DC

## 2020-04-08 NOTE — ED Triage Notes (Signed)
Pt presents with lower belly pain and low appetite x 1 week. Pt  Has no take any medication for complaints.

## 2020-04-08 NOTE — ED Provider Notes (Signed)
[facility]    CSN: [id number] Arrival date & time: 04/08/20  7001      History   Chief Complaint Chief Complaint  Patient presents with  . Abdominal Pain    HPI Jill Huff is a 29 y.o. female presenting today for evaluation of decreased appetite and abdominal pain.  Patient reports she has had lower abdominal pain which is intermittent in nature for 1 week.  Described as dull.  Denies vomiting, but has had some nausea at times.  Denies diarrhea or change in bowel movements.  Denies urinary symptoms of dysuria, frequency or urgency.  Denies any abnormal bleeding or discharge.  Last menstrual cycle 02/19/2020.  Is not on birth control.  Has had prior pregnancies and denies any complications.  HPI  Past Medical History:  Diagnosis Date  . Auditory complaints of both ears 01/01/2018  . Caries 12/05/2018  . Hepatitis    Hep B positive  . Refugee health examination 07/24/2017  . 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 Ganglion cyst of dorsum of left wrist 04/06/2020  . 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 NO PAST SURGERIES      OB History    Gravida  2   Para  1   Term  1   Preterm      AB      Living  1     SAB      TAB      Ectopic      Multiple      Live Births               Home Medications    Prior to Admission medications   Medication Sig Start Date End Date Taking? Authorizing Provider  Doxylamine-Pyridoxine 10-10 MG TBEC Take 2 tablets by mouth at bedtime. 04/08/20   Markeda Narvaez C, PA-C  Cetirizine HCl 10 MG CAPS Take 1 capsule (10 mg total) by mouth daily. Patient not taking: Reported on 04/01/2020 01/13/19 04/08/20  Belinda Fisher, PA-C  fluticasone [facility]) 50 MCG/ACT nasal spray Place 2 sprays  into both nostrils daily. Patient not taking: Reported on 04/01/2020 01/13/19 04/08/20  Lurline Idol    Family History History reviewed. No pertinent family history.  Social History Social History   Tobacco Use  . Smoking status: Never Smoker  . Smokeless tobacco: Never Used  Vaping Use  . Vaping Use: Never used  Substance Use Topics  . Alcohol use: No  . Drug use: No     Allergies   Patient has no known allergies.   Review of Systems Review of Systems  Constitutional: Positive for appetite change. Negative for activity change and fever.  Respiratory: Negative for shortness of breath.   Cardiovascular: Negative for chest pain.  Gastrointestinal: Positive for abdominal pain and nausea. Negative for diarrhea and vomiting.  Genitourinary: Negative for dysuria, flank pain, genital sores, hematuria, menstrual problem, vaginal bleeding, vaginal discharge and vaginal pain.  Musculoskeletal: Negative for back pain.  Skin: Negative for rash.  Neurological: Negative for dizziness, light-headedness and headaches.     Physical Exam Triage Vital Signs ED Triage Vitals  Enc Vitals Group     BP 04/08/20 1155 122/73     Pulse Rate 04/08/20 1155 72  Resp 04/08/20 1155 16     Temp 04/08/20 1155 98.7 F (37.1 C)     Temp Source 04/08/20 1155 Oral     SpO2 04/08/20 1155 100 %     Weight --      Height --      Head Circumference --      Peak Flow --      Pain Score 04/08/20 1153 6     Pain Loc --      Pain Edu? --      Excl. in GC? --    No data found.  Updated Vital Signs BP 122/73 (BP Location: Left Arm)   Pulse 72   Temp 98.7 F (37.1 C) (Oral)   Resp 16   LMP 02/19/2020 (Exact Date)   SpO2 100%   Visual Acuity Right Eye Distance:   Left Eye Distance:   Bilateral Distance:    Right Eye Near:   Left Eye Near:    Bilateral Near:     Physical Exam Vitals and nursing note reviewed.  Constitutional:      Appearance: She is well-developed.     Comments: No  acute distress  HENT:     Head: Normocephalic and atraumatic.     Nose: Nose normal.  Eyes:     Conjunctiva/sclera: Conjunctivae normal.  Cardiovascular:     Rate and Rhythm: Normal rate.  Pulmonary:     Effort: Pulmonary effort is normal. No respiratory distress.  Abdominal:     General: There is no distension.     Comments: Soft, nondistended, tenderness to palpation to mid lower abdomen, negative rebound  Musculoskeletal:        General: Normal range of motion.     Cervical back: Neck supple.  Skin:    General: Skin is warm and dry.  Neurological:     Mental Status: She is alert and oriented to person, place, and time.      UC Treatments / Results  Labs (all labs ordered are listed, but only abnormal results are displayed) Labs Reviewed  POC URINE PREG, ED - Abnormal; Notable for the following components:      Result Value   Preg Test, Ur POSITIVE (*)    All other components within normal limits  POCT URINALYSIS DIP (DEVICE)    EKG   Radiology No results found.  Procedures Procedures (including critical care time)  Medications Ordered in UC Medications - No data to display  Initial Impression / Assessment and Plan / UC Course  I have reviewed the triage vital signs and the nursing notes.  Pertinent labs & imaging results that were available during my care of the patient were reviewed by me and considered in my medical decision making (see chart for details).    Positive pregnancy test in clinic, estimated approximately 7 weeks based off last menstrual cycle, 1 week of dull abdominal pain, recommended to follow-up in women's hospital/MAU for ultrasound/further evaluation of pain.  Provided contact for OB/GYN.  Diclegis for nausea.  Instructions were given with Swahili interpreter and patient verbalized understanding.  Discussed strict return precautions. Patient verbalized understanding and is agreeable with plan.   Final Clinical Impressions(s) / UC  Diagnoses   Final diagnoses:  Lower abdominal pain  Positive pregnancy test     Discharge Instructions     Please go to [facility] hospital for further evaluation of pain in pregnancy--> Entrance [address]   Follow up with OBGYN- contact below  Diclegis: Two tablets  at bedtime on day 1 and 2; if symptoms persist, take 1 tablet in morning and 2 tablets at bedtime on day 3; if symptoms persist, may increase to 1 tablet in morning, 1 tablet mid-afternoon, and 2 tablets at bedtime on day 4 (maximum: doxylamine 40 mg/pyridoxine 40 mg (4 tablets) per day). OR One-half of the 25 mg Unisom sleep tablet over-the-counter tablet or two chewable 5 mg tablets can be used off-label as an antiemetic. In addition, pyridoxine 25 mg, also available over-the-counter, is taken three or four times per day;This is a reasonable, less expensive substitute for combination tablets.       ED Prescriptions    Medication Sig Dispense Auth. Provider   Doxylamine-Pyridoxine 10-10 MG TBEC Take 2 tablets by mouth at bedtime. 60 tablet Anjel Pardo, [place] C, PA-C     PDMP not reviewed this encounter.   Fatin Bachicha, [place] C, PA-C 04/08/20 1226

## 2020-04-08 NOTE — Discharge Instructions (Signed)
Please go to [facility] hospital for further evaluation of pain in pregnancy--> Entrance [address]   Follow up with OBGYN- contact below  Diclegis: Two tablets at bedtime on day 1 and 2; if symptoms persist, take 1 tablet in morning and 2 tablets at bedtime on day 3; if symptoms persist, may increase to 1 tablet in morning, 1 tablet mid-afternoon, and 2 tablets at bedtime on day 4 (maximum: doxylamine 40 mg/pyridoxine 40 mg (4 tablets) per day). OR One-half of the 25 mg Unisom sleep tablet over-the-counter tablet or two chewable 5 mg tablets can be used off-label as an antiemetic. In addition, pyridoxine 25 mg, also available over-the-counter, is taken three or four times per day;This is a reasonable, less expensive substitute for combination tablets.

## 2020-04-09 ENCOUNTER — Ambulatory Visit: Payer: Medicaid Other | Admitting: Licensed Clinical Social Worker

## 2020-04-09 DIAGNOSIS — Z7189 Other specified counseling: Secondary | ICD-10-CM

## 2020-04-09 NOTE — Chronic Care Management (AMB) (Signed)
Care Management   Clinical Social Work initial Note  04/09/2020 Name: Jill Huff MRN: [id number] DOB: [birth year] Jill Huff is a 29 y.o. year old female who sees McDiarmid, Leighton Roach, MD for primary care. The Care Management team was consulted by PCP to assist the patient with Transportation Needs to out of town medical appointment and Care Coordination for speciality appointment.    Interpreter:Yes.   ; Name: Jens Som , # [phone] and Language: Swahili LCSW reached out to [employer] today by phone to introduce self, assess needs and barriers to care.    Assessment: Patient has a case Production designer, theatre/television/film with [employer] [phone].  She understands that she need go to appointment in [place] and is open to all support.       Recommendation: Patient may benefit from, and is in agreement to work with Nicole Cella from Engineer, water program as she also speaks swahili. Plan:  1. Nicole Cella will F/U with patient and reach out to LCSW as needed 2.   LCSW will F/U with referral in two weeks to check on status referral faxed to [facility]. Franklyn Lor in [facility] on July 22nd   Review of patient status, including review of consultants reports, relevant laboratory and other test results, and collaboration with appropriate care team members and the patient's provider was performed as part of comprehensive patient evaluation and provision of chronic care management services.    Advance Directive Status: Not addressed in this encounter SDOH (Social Determinants of Health) assessments performed: No:     ; Goals Addressed            This Visit's Progress   . speciality appointment in [facility] ENTRY (see longitudinal plan of care for additional care plan information)  Current Barriers:  . Patient needs assistance with removing barriers with coordinating speciality appointments,  . Acknowledges deficits and needs support, education and care coordination in order to meet  this unmet need  . PCP has placed referral with [facility] referral coordinator, patient needs to see a provider in [place] and will help coordinating transportation  Clinical Goal(s):  Over the next 60 days, patient have appointment and be able to go to medical speciality in [place] safely  Interventions: . Assessed patient needs, barrier and how currently meeting needs . Shared with patient information about congregational nurse program. She is in agreement.  Referral placed to Arman Bogus [phone] with Congregational Nurse program . Collaborated with PCP regarding patient needs . Collaborated with [facility] referral Coordinator, and Congregational Nurse Dorothy Muhoro . Patient Self Care Activities: . Patient is unable to independently navigate getting DME without care coordination support  Initial goal documentation        Outpatient Encounter Medications as of 04/09/2020  Medication Sig  . Doxylamine-Pyridoxine 10-10 MG TBEC Take 2 tablets by mouth at bedtime.  . [DISCONTINUED] Cetirizine HCl 10 MG CAPS Take 1 capsule (10 mg total) by mouth daily. (Patient not taking: Reported on 04/01/2020)  . [DISCONTINUED] fluticasone (FLONASE) 50 MCG/ACT nasal spray Place 2 sprays into both nostrils daily. (Patient not taking: Reported on 04/01/2020)   No facility-administered encounter medications on file as of 04/09/2020.       Information about Care Management services was shared with [name] today including:  1. Care Management services include personalized support from designated clinical staff supervised by her physician, including individualized plan of care and coordination with other care providers 2. Remind patient of 24/7  contact phone numbers to provider's office for assistance with urgent and routine care needs. 3. Care Management services are voluntary and patient may stop at any time .  Patient agreed to services provided today and verbal consent obtained.     Sammuel Hines, LCSW Care  Management & Coordination  [facility]   [phone] 3:45 PM

## 2020-04-12 ENCOUNTER — Inpatient Hospital Stay (HOSPITAL_COMMUNITY): Payer: Medicaid Other

## 2020-04-12 ENCOUNTER — Encounter (HOSPITAL_COMMUNITY): Payer: Self-pay | Admitting: Family Medicine

## 2020-04-12 ENCOUNTER — Other Ambulatory Visit: Payer: Self-pay

## 2020-04-12 ENCOUNTER — Inpatient Hospital Stay (HOSPITAL_COMMUNITY)
Admission: AD | Admit: 2020-04-12 | Discharge: 2020-04-12 | Disposition: A | Discharge status: Home / Self Care | Payer: Medicaid Other | Attending: Family Medicine | Admitting: Family Medicine

## 2020-04-12 DIAGNOSIS — O209 Hemorrhage in early pregnancy, unspecified: Secondary | ICD-10-CM | POA: Diagnosis not present

## 2020-04-12 DIAGNOSIS — R109 Unspecified abdominal pain: Secondary | ICD-10-CM

## 2020-04-12 DIAGNOSIS — Z3A01 Less than 8 weeks gestation of pregnancy: Secondary | ICD-10-CM | POA: Insufficient documentation

## 2020-04-12 DIAGNOSIS — O208 Other hemorrhage in early pregnancy: Secondary | ICD-10-CM | POA: Insufficient documentation

## 2020-04-12 DIAGNOSIS — O468X1 Other antepartum hemorrhage, first trimester: Secondary | ICD-10-CM | POA: Diagnosis present

## 2020-04-12 DIAGNOSIS — O26891 Other specified pregnancy related conditions, first trimester: Secondary | ICD-10-CM | POA: Insufficient documentation

## 2020-04-12 DIAGNOSIS — O418X10 Other specified disorders of amniotic fluid and membranes, first trimester, not applicable or unspecified: Secondary | ICD-10-CM

## 2020-04-12 DIAGNOSIS — O200 Threatened abortion: Secondary | ICD-10-CM

## 2020-04-12 DIAGNOSIS — O26899 Other specified pregnancy related conditions, unspecified trimester: Secondary | ICD-10-CM | POA: Diagnosis present

## 2020-04-12 DIAGNOSIS — R1030 Lower abdominal pain, unspecified: Secondary | ICD-10-CM | POA: Diagnosis not present

## 2020-04-12 HISTORY — DX: Threatened abortion: O20.0

## 2020-04-12 HISTORY — DX: Other specified disorders of amniotic fluid and membranes, first trimester, not applicable or unspecified: O41.8X10

## 2020-04-12 LAB — CBC WITH DIFFERENTIAL/PLATELET
Abs Immature Granulocytes: 0.01 10*3/uL (ref 0.00–0.07)
Basophils Absolute: 0 10*3/uL (ref 0.0–0.1)
Basophils Relative: 1 %
Eosinophils Absolute: 0 10*3/uL (ref 0.0–0.5)
Eosinophils Relative: 1 %
HCT: 42.4 % (ref 36.0–46.0)
Hemoglobin: 13.5 g/dL (ref 12.0–15.0)
Immature Granulocytes: 0 %
Lymphocytes Relative: 36 %
Lymphs Abs: 2.3 10*3/uL (ref 0.7–4.0)
MCH: 25.3 pg — ABNORMAL LOW (ref 26.0–34.0)
MCHC: 31.8 g/dL (ref 30.0–36.0)
MCV: 79.4 fL — ABNORMAL LOW (ref 80.0–100.0)
Monocytes Absolute: 0.5 10*3/uL (ref 0.1–1.0)
Monocytes Relative: 8 %
Neutro Abs: 3.5 10*3/uL (ref 1.7–7.7)
Neutrophils Relative %: 54 %
Platelets: 190 10*3/uL (ref 150–400)
RBC: 5.34 MIL/uL — ABNORMAL HIGH (ref 3.87–5.11)
RDW: 14.6 % (ref 11.5–15.5)
WBC: 6.5 10*3/uL (ref 4.0–10.5)
nRBC: 0 % (ref 0.0–0.2)

## 2020-04-12 LAB — URINALYSIS, ROUTINE W REFLEX MICROSCOPIC
Bilirubin Urine: NEGATIVE
Glucose, UA: NEGATIVE mg/dL
Hgb urine dipstick: NEGATIVE
Ketones, ur: NEGATIVE mg/dL
Leukocytes,Ua: NEGATIVE
Nitrite: NEGATIVE
Protein, ur: 30 mg/dL — AB
Specific Gravity, Urine: 1.025 (ref 1.005–1.030)
pH: 7 (ref 5.0–8.0)

## 2020-04-12 LAB — HCG, QUANTITATIVE, PREGNANCY: hCG, Beta Chain, Quant, S: 59873 m[IU]/mL — ABNORMAL HIGH (ref <5)

## 2020-04-12 LAB — WET PREP, GENITAL
Clue Cells Wet Prep HPF POC: NONE SEEN
Sperm: NONE SEEN
Trich, Wet Prep: NONE SEEN
Yeast Wet Prep HPF POC: NONE SEEN

## 2020-04-12 LAB — HIV ANTIBODY (ROUTINE TESTING W REFLEX): HIV Screen 4th Generation wRfx: NONREACTIVE

## 2020-04-12 LAB — ABO/RH: ABO/RH(D): O POS

## 2020-04-12 NOTE — MAU Note (Signed)
.   Jill Huff is a 29 y.o. at [redacted]w[redacted]d here in MAU reporting:she has had lower abdominal cramping for a week. Denies any VB LMP: 02/24/20 Onset of complaint: ongoing for a week Pain score: 6 Vitals:   04/12/20 1110 04/12/20 1111  BP: 109/68   Pulse: 80   Resp: 18   Temp: 98.2 F (36.8 C)   SpO2:  100%     FHT: Lab orders placed from triage: UA

## 2020-04-12 NOTE — Discharge Instructions (Signed)
Return to MAU:  If you have heavier bleeding that soaks through more that 2 pads per hour for an hour or more  If you bleed so much that you feel like you might pass out or you do pass out  If you have significant abdominal pain that is not improved with Tylenol 1000 mg every 6 hours as needed for pain  If you develop a fever > 100.5    Center for [facility] Healthcare Prenatal Care Providers          Center for [facility] Healthcare locations:  Hours may vary. Please call for an appointment  Center for [facility] @ [place]  [address] [place]  [phone]  Center for [facility] @ [facility]   [address]  [phone]  Center For [facility] Healthcare @ [facility]       [address] [phone]            Center for [facility] Healthcare @ [place]     [phone]          Center for [facility] Healthcare @ [facility]   [phone]  Center for [facility] @ [facility]  [address] [phone]     Center for [facility] @ [facility]  [address]   [phone]

## 2020-04-12 NOTE — MAU Provider Note (Signed)
History     CSN: [id number]  Arrival date and time: 04/12/20 1022   First Provider Initiated Contact with Patient 04/12/20 1230 - [employer] video interpreter Jill Huff [phone] used for history taking, assessment and exam    Chief Complaint  Patient presents with  . Abdominal Pain   [name] is a 29 y.o. year old G3P2002 female at [redacted]w[redacted]d weeks gestation who presents to MAU reporting lower abdominal cramping for 1 week; rated 6 out of 10.  She denies vaginal bleeding or any abnormal vaginal discharge.  She was seen in the emergency department on April 08, 2020 for lower abdominal pain and "low appetite".  She was prescribed Diclegis. She does not have PNC established at this time.   OB History    Gravida  3   Para  2   Term  2   Preterm      AB      Living  2     SAB      TAB      Ectopic      Multiple      Live Births  2           Past Medical History:  Diagnosis Date  . Auditory complaints of both ears 01/01/2018  . Caries 12/05/2018  . Hepatitis    Hep B positive  . Refugee health examination 07/24/2017  . 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 NO PAST SURGERIES      History reviewed. No pertinent family history.  Social History   Tobacco Use  . Smoking status: Never Smoker  . Smokeless tobacco: Never Used  Vaping Use  . Vaping Use: Never used  Substance Use Topics  . Alcohol use: No  . Drug use: No    Allergies: No Known Allergies  Medications Prior to Admission  Medication Sig Dispense Refill Last Dose  . Doxylamine-Pyridoxine 10-10 MG TBEC Take 2 tablets by mouth at bedtime. 60 tablet 0  at not taking    Review of Systems  Constitutional: Negative.   HENT: Negative.   Eyes: Negative.   Respiratory: Negative.   Cardiovascular: Negative.   Gastrointestinal: Negative.   Endocrine: Negative.   Genitourinary: Positive for pelvic pain (x 1 week;  rated 6/10).  Musculoskeletal: Negative.   Skin: Negative.   Allergic/Immunologic: Negative.   Neurological: Negative.   Hematological: Negative.   Psychiatric/Behavioral: Negative.    Physical Exam   Blood pressure 109/68, pulse 80, temperature 98.2 F (36.8 C), resp. rate 18, weight 76.7 kg, last menstrual period 02/24/2020, SpO2 100 %.  Physical Exam Vitals and nursing note reviewed. Exam conducted with a chaperone present.  Constitutional:      Appearance: She is well-developed and normal weight.  HENT:     Head: Normocephalic and atraumatic.  Cardiovascular:     Rate and Rhythm: Normal rate.  Pulmonary:     Effort: Pulmonary effort is normal.  Abdominal:     General: Abdomen is flat.  Genitourinary:    Vagina: Normal.     Cervix: No cervical motion tenderness or friability.     Uterus: Enlarged.      Adnexa:        Right: Tenderness present.        Left: Tenderness present.      Comments: Uterus: non-tender, SE: cervix is smooth, pink, no lesions, small amt of thick, white vaginal d/c -- WP, GC/CT done,  closed/long/firm, no CMT or friability, no adnexal tenderness  Skin:    General: Skin is warm and dry.  Neurological:     Mental Status: She is alert and oriented to person, place, and time.  Psychiatric:        Mood and Affect: Mood normal.        Behavior: Behavior normal.     MAU Course  Procedures  MDM CCUA UPT CBC ABO/Rh HCG Wet Prep GC/CT -- pending HIV -- pending OB < 14 wks [place] with TV  Results for orders placed or performed during the hospital encounter of 04/12/20 (from the past 24 hour(s))  Urinalysis, Routine w reflex microscopic     Status: Abnormal   Collection Time: 04/12/20 10:29 AM  Result Value Ref Range   Color, Urine YELLOW YELLOW   APPearance CLEAR CLEAR   Specific Gravity, Urine 1.025 1.005 - 1.030   pH 7.0 5.0 - 8.0   Glucose, UA NEGATIVE NEGATIVE mg/dL   Hgb urine dipstick NEGATIVE NEGATIVE   Bilirubin Urine NEGATIVE  NEGATIVE   Ketones, ur NEGATIVE NEGATIVE mg/dL   Protein, ur 30 (A) NEGATIVE mg/dL   Nitrite NEGATIVE NEGATIVE   Leukocytes,Ua NEGATIVE NEGATIVE   RBC / HPF 0-5 0 - 5 RBC/hpf   WBC, UA 0-5 0 - 5 WBC/hpf   Bacteria, UA RARE (A) NONE SEEN   Squamous Epithelial / LPF 0-5 0 - 5   Mucus PRESENT   Wet prep, genital     Status: Abnormal   Collection Time: 04/12/20 10:36 AM   Specimen: PATH Cytology Cervicovaginal Ancillary Only  Result Value Ref Range   Yeast Wet Prep HPF POC NONE SEEN NONE SEEN   Trich, Wet Prep NONE SEEN NONE SEEN   Clue Cells Wet Prep HPF POC NONE SEEN NONE SEEN   WBC, Wet Prep HPF POC FEW (A) NONE SEEN   Sperm NONE SEEN   CBC with Differential/Platelet     Status: Abnormal   Collection Time: 04/12/20 11:13 AM  Result Value Ref Range   WBC 6.5 4.0 - 10.5 K/uL   RBC 5.34 (H) 3.87 - 5.11 MIL/uL   Hemoglobin 13.5 12.0 - 15.0 g/dL   HCT 46.9 36 - 46 %   MCV 79.4 (L) 80.0 - 100.0 fL   MCH 25.3 (L) 26.0 - 34.0 pg   MCHC 31.8 30.0 - 36.0 g/dL   RDW 62.9 52.8 - 41.3 %   Platelets 190 150 - 400 K/uL   nRBC 0.0 0.0 - 0.2 %   Neutrophils Relative % 54 %   Neutro Abs 3.5 1.7 - 7.7 K/uL   Lymphocytes Relative 36 %   Lymphs Abs 2.3 0.7 - 4.0 K/uL   Monocytes Relative 8 %   Monocytes Absolute 0.5 0 - 1 K/uL   Eosinophils Relative 1 %   Eosinophils Absolute 0.0 0 - 0 K/uL   Basophils Relative 1 %   Basophils Absolute 0.0 0 - 0 K/uL   Immature Granulocytes 0 %   Abs Immature Granulocytes 0.01 0.00 - 0.07 K/uL  hCG, quantitative, pregnancy     Status: Abnormal   Collection Time: 04/12/20 11:13 AM  Result Value Ref Range   hCG, Beta Chain, Quant, S 59,873 (H) <5 mIU/mL  HIV Antibody (routine testing w rflx)     Status: None   Collection Time: 04/12/20 11:13 AM  Result Value Ref Range   HIV Screen 4th Generation wRfx Non Reactive Non Reactive    US OB LESS THAN  14 WEEKS WITH OB TRANSVAGINAL  Result Date: 04/12/2020 CLINICAL DATA:  Pregnant patient with cramping for 1  week. EXAM: OBSTETRIC <14 WK ULTRASOUND TECHNIQUE: Transabdominal ultrasound was performed for evaluation of the gestation as well as the maternal uterus and adnexal regions. COMPARISON:  None. FINDINGS: Intrauterine gestational sac: Single visualized. Yolk sac:  Visualized. Embryo:  Visualized. Cardiac Activity: Detected. Heart Rate: 119 bpm CRL:   5.0 mm   6 w 1 d                  [place] EDC: 12/05/2020. Subchorionic hemorrhage: Small subchorionic hemorrhage measures 2.2 x 0.9 x 1.1 cm. Maternal uterus/adnexae: Corpus luteum cyst on the left noted. IMPRESSION: Single living intrauterine pregnancy. Small subchorionic hemorrhage. Electronically Signed   By: Drusilla Kanner M.D.   On: 04/12/2020 14:08     Assessment and Plan**  Abdominal pain affecting pregnancy  - Information provided on abdominal pain in pregnancy  - Advised to take Tylenol 1000 mg every 6 hours prn pain  Subchorionic hematoma in first trimester, single or unspecified fetus - Discussed the increased risk for miscarriage with [facility] - Discussed bleeding precautions - Information provided on [facility] - Return to MAU:  If you have heavier bleeding that soaks through more that 2 pads per hour for an hour or more  If you bleed so much that you feel like you might pass out or you do pass out  If you have significant abdominal pain that is not improved with Tylenol 1000 mg every 6 hours as needed for pain  If you develop a fever > 100.5   Threatened miscarriage - Information provided on threatened miscarriage   - Discharge patient - Get scheduled with OB provider by 10 wks - Patient verbalized an understanding of the plan of care and agrees.   **All discharge instructions and discussion of results were done with the assistance of [employer] video Interpreter [place]ina [phone]  Raelyn Mora, MSN, CNM 04/12/2020, 12:31 PM

## 2020-04-13 ENCOUNTER — Telehealth: Payer: Self-pay

## 2020-04-13 LAB — GC/CHLAMYDIA PROBE AMP (~~LOC~~) NOT AT ARMC
Chlamydia: NEGATIVE
Comment: NEGATIVE
Comment: NORMAL
Neisseria Gonorrhea: NEGATIVE

## 2020-04-13 LAB — RPR: RPR Ser Ql: NONREACTIVE

## 2020-04-13 NOTE — Telephone Encounter (Signed)
Contacted patient  to  establish care. She does report wrist pain more on the left than right. She is taking medications as advised. I informed her that, the doctors office is working on a referral to [facility] and as soon as I know the date and time, I will let her know. Client is a mother of 2 and currently pregnant. She is yet to see OBGYN.  I will follow up on this.  Client has my cell number to call if need be. Nicole Cella Jet Armbrust RN BSN PCCn [phone]

## 2020-04-16 ENCOUNTER — Ambulatory Visit: Payer: Self-pay | Admitting: Licensed Clinical Social Worker

## 2020-04-16 DIAGNOSIS — Z7189 Other specified counseling: Secondary | ICD-10-CM

## 2020-04-16 NOTE — Chronic Care Management (AMB) (Signed)
   Social Work  Care Management Collaboration 04/16/2020 Name: Veanna Dower MRN: [id number] DOB: 1991/04/18 Derionna Salvador is a 29 y.o. year old female who sees McDiarmid, Leighton Roach, MD for primary care.    Intervention: Patient was not interviewed or contacted during this encounter.  LCSW collaborated with [facility] referral coordinator and Engineer, water Dorothy. Plan: LCSW will continue to collaboration and consultation as needed   Review of patient status, including review of consultants reports, relevant laboratory and other test results, and collaboration with appropriate care team members and the patient's provider was performed as part of comprehensive patient evaluation and provision of chronic care management services.    Goals Addressed            This Visit's Progress   . speciality appointment in [facility] ENTRY (see longitudinal plan of care for additional care plan information)  Current Barriers & Progress:  . Patient needs assistance with removing barriers with coordinating speciality appointments,  . Acknowledges deficits and needs support, education and care coordination in order to meet this unmet need  . PCP has placed referral with [facility] referral coordinator, patient needs to see a provider in [place] and will help transportation  . Patient established care with congregational nurse program Clinical Goal(s):  Over the next 60 days, patient have appointment and be able to go to medical speciality in [facility] safely  Interventions: . Assessed patient needs, barrier and how currently meeting needs . Shared with patient information about congregational nurse program. She is in agreement.  Referral placed to Arman Bogus [phone] with Congregational Nurse program . Collaborated with PCP regarding patient needs . Collaborated with [facility] referral Coordinator, and Congregational Nurse Dorothy Muhoro  . Referral faxed to Dr. Franklyn Lor ([facility]) he will review records and  contact patient to schedule the appointment. . Patient Self Care Activities: . Patient is unable to independently navigate getting DME without care coordination support  Please see past updates related to this goal by clicking on the "Past Updates" button in the selected goal       Sammuel Hines, LCSW Care Management & Coordination  [facility] Family [facility] / [facility]   [phone] 2:45 PM

## 2020-04-21 ENCOUNTER — Ambulatory Visit
Admission: RE | Admit: 2020-04-21 | Discharge: 2020-04-21 | Disposition: A | Discharge status: Home / Self Care | Payer: Medicaid Other | Source: Ambulatory Visit | Attending: Nurse Practitioner | Admitting: Nurse Practitioner

## 2020-04-21 ENCOUNTER — Telehealth: Payer: Self-pay

## 2020-04-21 DIAGNOSIS — R1011 Right upper quadrant pain: Secondary | ICD-10-CM | POA: Diagnosis not present

## 2020-04-21 NOTE — Telephone Encounter (Signed)
I have contacted [employer] to provide a ride for this patient for appointment today at [facility] center. Nicole Cella Chandon Lazcano RN BSN PCCn [phone]-cell [phone]-office

## 2020-04-23 ENCOUNTER — Ambulatory Visit (HOSPITAL_COMMUNITY)
Admission: EM | Admit: 2020-04-23 | Discharge: 2020-04-23 | Disposition: A | Discharge status: Home / Self Care | Payer: Medicaid Other

## 2020-04-23 ENCOUNTER — Encounter (HOSPITAL_COMMUNITY): Payer: Self-pay | Admitting: Obstetrics and Gynecology

## 2020-04-23 ENCOUNTER — Other Ambulatory Visit: Payer: Self-pay

## 2020-04-23 ENCOUNTER — Inpatient Hospital Stay (HOSPITAL_COMMUNITY)
Admission: AD | Admit: 2020-04-23 | Discharge: 2020-04-23 | Disposition: A | Discharge status: Home / Self Care | Payer: Medicaid Other | Attending: Obstetrics and Gynecology | Admitting: Obstetrics and Gynecology

## 2020-04-23 ENCOUNTER — Encounter (HOSPITAL_COMMUNITY): Payer: Self-pay | Admitting: Emergency Medicine

## 2020-04-23 DIAGNOSIS — K759 Inflammatory liver disease, unspecified: Secondary | ICD-10-CM | POA: Diagnosis not present

## 2020-04-23 DIAGNOSIS — O99611 Diseases of the digestive system complicating pregnancy, first trimester: Secondary | ICD-10-CM | POA: Diagnosis not present

## 2020-04-23 DIAGNOSIS — O26891 Other specified pregnancy related conditions, first trimester: Secondary | ICD-10-CM | POA: Diagnosis present

## 2020-04-23 DIAGNOSIS — Z3A08 8 weeks gestation of pregnancy: Secondary | ICD-10-CM | POA: Diagnosis not present

## 2020-04-23 DIAGNOSIS — R109 Unspecified abdominal pain: Secondary | ICD-10-CM

## 2020-04-23 DIAGNOSIS — K5900 Constipation, unspecified: Secondary | ICD-10-CM | POA: Diagnosis not present

## 2020-04-23 DIAGNOSIS — Z3490 Encounter for supervision of normal pregnancy, unspecified, unspecified trimester: Secondary | ICD-10-CM

## 2020-04-23 DIAGNOSIS — O26899 Other specified pregnancy related conditions, unspecified trimester: Secondary | ICD-10-CM

## 2020-04-23 MED ORDER — MAGNESIUM 200 MG PO TABS
400.0000 mg | ORAL_TABLET | Freq: Every day | ORAL | 1 refills | Status: DC
Start: 2020-04-23 — End: 2020-12-02

## 2020-04-23 MED ORDER — FLEET ENEMA 7-19 GM/118ML RE ENEM
1.0000 | ENEMA | Freq: Once | RECTAL | Status: AC
  Administered 2020-04-23: 1 via RECTAL

## 2020-04-23 NOTE — ED Triage Notes (Signed)
Pt presents with abdominal pain, Pt states "stomach feels very heavy" xs 2-3 weeks. Denies N,V,D, or fever.  Pt was seen at [facility] on 04/08/20 with positive pregnancy test. States she does not have OBGYN to follow for pregnancy.

## 2020-04-23 NOTE — Discharge Instructions (Signed)
Center for [facility] Prenatal Care Providers          Center for [facility] locations:  Hours may vary. Please call for an appointment  Center for [facility] @ [facility]  [address] (at the corner of [address] and [address])  [phone]  Center for [facility] @ [facility]   [address]  [phone]  Center For [facility] @ [facility]       [address] [phone]            Center for [facility] @ [place]     [phone]          Center for [facility] @ [facility]   [phone]  Center for [facility] @ [facility]  [address] [phone]     Center for [facility] @ [facility]  [address]   [phone]

## 2020-04-23 NOTE — MAU Provider Note (Signed)
History     CSN: [id number]  Arrival date and time: 04/23/20 1051   First Provider Initiated Contact with Patient 04/23/20 1144 - Swahili video interpreter not available, spouse speaks and understands English and interpreting     Chief Complaint  Patient presents with  . Constipation  . Abdominal Pain   Ms. Carloyn Lahue is a 29 y.o. year old G65P2002 female at [redacted]w[redacted]d weeks gestation who presents to MAU reporting abdominal "fullness", discomfort, and constiaption. She reports having a hard BM Sunday 04/18/20 & Wednesday 04/21/20. She reports her appetite has been good. She reports eating "PooPoo", vegetables and meat (mostly fish). She denies VB or abnormal d/c. She has had a normal OB U/S on 04/12/2020. She also had a normal RUQ U/S on 04/21/2020. She has a h/o [facility] with this pregnancy. She is well-dated by LMP. She has not established PNC at this time. The FOB is requesting OB provider list.   OB History    Gravida  3   Para  2   Term  2   Preterm      AB      Living  2     SAB      TAB      Ectopic      Multiple      Live Births  2           Past Medical History:  Diagnosis Date  . Auditory complaints of both ears 01/01/2018  . Caries 12/05/2018  . Hepatitis    Hep B positive  . Refugee health examination 07/24/2017  . 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 NO PAST SURGERIES      History reviewed. No pertinent family history.  Social History   Tobacco Use  . Smoking status: Never Smoker  . Smokeless tobacco: Never Used  Vaping Use  . Vaping Use: Never used  Substance Use Topics  . Alcohol use: No  . Drug use: No    Allergies: No Known Allergies  Medications Prior to Admission  Medication Sig Dispense Refill Last Dose  . Doxylamine-Pyridoxine 10-10 MG TBEC Take 2 tablets by mouth at bedtime. 60 tablet 0 04/22/2020 at Unknown time    Review of Systems  Constitutional: Negative.    HENT: Negative.   Eyes: Negative.   Respiratory: Negative.   Cardiovascular: Negative.   Gastrointestinal: Positive for abdominal distention and constipation.  Endocrine: Negative.   Genitourinary: Negative.   Musculoskeletal: Negative.   Skin: Negative.   Allergic/Immunologic: Negative.   Neurological: Negative.   Hematological: Negative.   Psychiatric/Behavioral: Negative.    Physical Exam   Blood pressure 107/66, pulse 88, temperature 98 F (36.7 C), temperature source Oral, resp. rate 18, weight 75.4 kg, last menstrual period 02/24/2020, SpO2 99 %.  Physical Exam Constitutional:      Appearance: She is well-developed and normal weight.  HENT:     Head: Normocephalic and atraumatic.  Cardiovascular:     Rate and Rhythm: Normal rate.     Heart sounds: Normal heart sounds.  Pulmonary:     Effort: Pulmonary effort is normal.  Abdominal:     General: Abdomen is flat. Bowel sounds are normal.     Palpations: Abdomen is soft.     Tenderness: There is no abdominal tenderness.  Genitourinary:    Comments: Not indicated Skin:    General: Skin is warm and dry.  Neurological:     Mental  Status: She is alert and oriented to person, place, and time.  Psychiatric:        Mood and Affect: Mood normal.        Behavior: Behavior normal.     MAU Course  Procedures  MDM Fleet Enema -- successful BM and reports "feeling better"  Assessment and Plan  Abdominal pain affecting pregnancy  - Information provided on abdominal pain in pregnancy   Constipation during pregnancy in first trimester  - Rx for Magnesium 400 mg hs - Information provided on constipation - Advised to increase daily water intake   - Discharge patient - OB Provider list given. Advised to schedule with OB office to establish [facility] - Patient verbalized an understanding of the plan of care and agrees.     Raelyn Mora, MSN, CNM 04/23/2020, 11:44 AM

## 2020-04-23 NOTE — ED Provider Notes (Signed)
[facility]   MRN: [id number] DOB: 1991/06/17  Subjective:   Jill Huff is a 29 y.o. female presenting for 2-week history of persistent and worsening abdominal pain all over.  Currently rates her pain 8 out of 10 and constant.  Patient has had positive beta hcg 04/12/2020.  She states that she has taken 2 medications but does not know which ones.  Does not know if she is allergic to any medications.  Duplicate chart shows no known food or drug allergies.  History reviewed. No pertinent past medical history.   History reviewed. No pertinent surgical history.  History reviewed. No pertinent family history.  Social History   Tobacco Use  . Smoking status: Never Smoker  . Smokeless tobacco: Never Used  Substance Use Topics  . Alcohol use: Never  . Drug use: Never    ROS   Objective:   Vitals: BP 111/75 (BP Location: Right Arm)   Pulse 84   Temp 98.6 F (37 C) (Oral)   Resp 18   LMP 02/20/2020 (Approximate)   SpO2 100%   Physical Exam Constitutional:      General: She is not in acute distress.    Appearance: Normal appearance. She is well-developed and normal weight. She is not ill-appearing, toxic-appearing or diaphoretic.  HENT:     Head: Normocephalic and atraumatic.     Right Ear: External ear normal.     Left Ear: External ear normal.     Nose: Nose normal.     Mouth/Throat:     Mouth: Mucous membranes are moist.     Pharynx: Oropharynx is clear.  Eyes:     General: No scleral icterus.    Extraocular Movements: Extraocular movements intact.     Pupils: Pupils are equal, round, and reactive to light.  Cardiovascular:     Rate and Rhythm: Normal rate and regular rhythm.     Pulses: Normal pulses.     Heart sounds: Normal heart sounds. No murmur heard.  No friction rub. No gallop.   Pulmonary:     Effort: Pulmonary effort is normal. No respiratory distress.     Breath sounds: Normal breath sounds. No stridor. No wheezing, rhonchi or rales.    Abdominal:     General: Bowel sounds are normal. There is no distension.     Palpations: Abdomen is soft. There is no mass.     Tenderness: There is abdominal tenderness in the periumbilical area, suprapubic area, left upper quadrant and left lower quadrant. There is no right CVA tenderness, left CVA tenderness, guarding or rebound.  Skin:    General: Skin is warm and dry.     Coloration: Skin is not pale.     Findings: No rash.  Neurological:     General: No focal deficit present.     Mental Status: She is alert and oriented to person, place, and time.  Psychiatric:        Mood and Affect: Mood normal.        Behavior: Behavior normal.        Thought Content: Thought content normal.        Judgment: Judgment normal.      Assessment and Plan :   PDMP not reviewed this encounter.  1. Left sided abdominal pain   2. Pregnancy, unspecified gestational age     Given severity of patient's pain and pregnancy recommended evaluation in the maternal admission unit.  Patient will have there now by personal vehicle which is appropriate  given stable vital signs.  Patient is able to ambulate on her own without assistance.  Counseled patient on potential for adverse effects with medications prescribed/recommended today, ER and return-to-clinic precautions discussed, patient verbalized understanding.     Wallis Bamberg, PA-C 04/23/20 1035

## 2020-04-23 NOTE — Discharge Instructions (Addendum)
Please report to the maternal admission unit now as you have severe abdominal pain and are pregnant.

## 2020-04-23 NOTE — MAU Note (Signed)
Patient presents to MAU at [redacted]w[redacted]d gestation with c/o abdominal "fullness", discomfort, and constipation. Patient denies having any vaginal bleeding.

## 2020-04-28 ENCOUNTER — Ambulatory Visit: Payer: Self-pay | Admitting: Licensed Clinical Social Worker

## 2020-04-28 DIAGNOSIS — Z7189 Other specified counseling: Secondary | ICD-10-CM

## 2020-04-28 NOTE — Chronic Care Management (AMB) (Signed)
° °  Social Work  Care Management Collaboration 04/28/2020 Name: Jill Huff MRN: [id number] DOB: 06-28-1991 Jill Huff is a 29 y.o. year old female who sees Patient, No Pcp Per for primary care. LCSW was consulted by PCP to assistance patient with care coordination.  Intervention: Patient was not interviewed or contacted during this encounter.  LCSW collaborated with trying to get appointment for patient.  See care plan below. Plan: If no return call if received LCSW will F/U in 5 to 7 days  Review of patient status, including review of consultants reports, relevant laboratory and other test results, and collaboration with appropriate care team members and the patient's provider was performed as part of comprehensive patient evaluation and provision of chronic care management services.    Goals Addressed            This Visit's Progress    speciality appointment in [facility] ENTRY (see longitudinal plan of care for additional care plan information)  Current Barriers & Progress:   Patient needs assistance with removing barriers with coordinating speciality appointments,   Acknowledges deficits and needs support, education and care coordination in order to meet this unmet need   PCP has placed referral with [facility] referral coordinator, patient needs to see a provider in [place] and will help transportation   Patient established care with congregational nurse program  No appointment scheduled with Dr. Franklyn Lor ([facility]) Clinical Goal(s):  Over the next 60 days, patient have appointment and be able to go to medical speciality in [facility] safely  Interventions:  Assessed patient needs, barrier and how currently meeting needs  Shared with patient information about congregational nurse program. She is in agreement.  Referral placed to Arman Bogus [phone] with Congregational Nurse program  Collaborated with PCP regarding patient needs  Collaborated with [facility] referral  Coordinator, and Congregational Nurse Arman Bogus   Referral faxed to Dr. Franklyn Lor ([facility]) he will review records and contact patient to schedule the appointment.  Called Dr. Franklyn Lor office to F/U on referral [phone], left message to call LCSW  Patient Self Care Activities:  Patient is unable to independently navigate getting DME without care coordination support  Please see past updates related to this goal by clicking on the "Past Updates" button in the selected goal       Sammuel Hines, LCSW Care Management & Coordination  [facility] Family Medicine / [facility]   [phone] 12:31 PM

## 2020-05-13 ENCOUNTER — Ambulatory Visit: Payer: Self-pay | Admitting: Licensed Clinical Social Worker

## 2020-05-13 ENCOUNTER — Ambulatory Visit: Payer: Medicaid Other | Admitting: Family Medicine

## 2020-05-13 NOTE — Chronic Care Management (AMB) (Signed)
   Social Work  Care Management Collaboration 05/13/2020 Name: Jill Huff MRN: [id number] DOB: [birth year] [name] is a 29 y.o. year old female who sees McDiarmid, Leighton Roach, MD for primary care. LCSW was consulted by PCP to assistance patient with getting specialty appointment for patient.  Intervention: Patient was not interviewed or contacted during this encounter.  LCSW collaborated with [facility] referral coordinator. Plan:  1. No further follow up required by LCSW at this time     2.   LCSW will continue to collaborate with Congregational Nurse and [facility] referral coordinator as needed.   Review of patient status, including review of consultants reports, relevant laboratory and other test results, and collaboration with appropriate care team members and the patient's provider was performed as part of comprehensive patient evaluation and provision of chronic care management services.    Goals Addressed            This Visit's Progress   . speciality appointment   Not on track    CARE PLAN ENTRY (see longitudinal plan of care for additional care plan information)  Current Barriers & Progress:  . Patient needs assistance with removing barriers with coordinating speciality appointments,  . Acknowledges deficits and needs support, education and care coordination in order to meet this unmet need  . PCP has placed referral with [facility] referral coordinator, patient needs to see a provider in [place] and will help transportation  . Patient established care with congregational nurse program . Referral coordinator continues to experience barriers with getting appointment for patient; provider in [place] declined referral Clinical Goal(s):  patient will have appointment and be able to go to specialist Interventions: Collaborated with [facility] referral coordinator  . Patient Self Care Activities: . Patient is unable to independently navigate getting appointment without care coordination support  Please see  past updates related to this goal by clicking on the "Past Updates" button in the selected goal       Sammuel Hines, LCSW Care Management & Coordination  [facility] Family Medicine / [facility]   [phone] 10:43 AM

## 2020-05-31 DIAGNOSIS — M21939 Unspecified acquired deformity of unspecified forearm: Secondary | ICD-10-CM | POA: Diagnosis not present

## 2020-05-31 DIAGNOSIS — Z3481 Encounter for supervision of other normal pregnancy, first trimester: Secondary | ICD-10-CM | POA: Diagnosis not present

## 2020-05-31 DIAGNOSIS — B181 Chronic viral hepatitis B without delta-agent: Secondary | ICD-10-CM | POA: Diagnosis not present

## 2020-05-31 DIAGNOSIS — N9089 Other specified noninflammatory disorders of vulva and perineum: Secondary | ICD-10-CM | POA: Diagnosis not present

## 2020-05-31 DIAGNOSIS — Z8616 Personal history of COVID-19: Secondary | ICD-10-CM | POA: Diagnosis not present

## 2020-05-31 DIAGNOSIS — Z789 Other specified health status: Secondary | ICD-10-CM | POA: Diagnosis not present

## 2020-05-31 DIAGNOSIS — O99019 Anemia complicating pregnancy, unspecified trimester: Secondary | ICD-10-CM | POA: Diagnosis not present

## 2020-05-31 LAB — OB RESULTS CONSOLE RUBELLA ANTIBODY, IGM: Rubella: IMMUNE

## 2020-06-17 DIAGNOSIS — B170 Acute delta-(super) infection of hepatitis B carrier: Secondary | ICD-10-CM | POA: Diagnosis not present

## 2020-06-17 DIAGNOSIS — B180 Chronic viral hepatitis B with delta-agent: Secondary | ICD-10-CM | POA: Diagnosis not present

## 2020-06-17 DIAGNOSIS — B181 Chronic viral hepatitis B without delta-agent: Secondary | ICD-10-CM | POA: Diagnosis not present

## 2020-06-17 DIAGNOSIS — Z3492 Encounter for supervision of normal pregnancy, unspecified, second trimester: Secondary | ICD-10-CM | POA: Diagnosis not present

## 2020-06-28 DIAGNOSIS — O99019 Anemia complicating pregnancy, unspecified trimester: Secondary | ICD-10-CM | POA: Diagnosis not present

## 2020-06-28 DIAGNOSIS — O281 Abnormal biochemical finding on antenatal screening of mother: Secondary | ICD-10-CM | POA: Diagnosis not present

## 2020-06-28 DIAGNOSIS — Z8616 Personal history of COVID-19: Secondary | ICD-10-CM | POA: Diagnosis not present

## 2020-06-28 DIAGNOSIS — Z23 Encounter for immunization: Secondary | ICD-10-CM | POA: Diagnosis not present

## 2020-06-28 DIAGNOSIS — Z3482 Encounter for supervision of other normal pregnancy, second trimester: Secondary | ICD-10-CM | POA: Diagnosis not present

## 2020-06-28 DIAGNOSIS — N9089 Other specified noninflammatory disorders of vulva and perineum: Secondary | ICD-10-CM | POA: Diagnosis not present

## 2020-06-28 DIAGNOSIS — M21939 Unspecified acquired deformity of unspecified forearm: Secondary | ICD-10-CM | POA: Diagnosis not present

## 2020-06-28 DIAGNOSIS — Z789 Other specified health status: Secondary | ICD-10-CM | POA: Diagnosis not present

## 2020-06-28 DIAGNOSIS — B181 Chronic viral hepatitis B without delta-agent: Secondary | ICD-10-CM | POA: Diagnosis not present

## 2020-07-06 DIAGNOSIS — M25532 Pain in left wrist: Secondary | ICD-10-CM | POA: Diagnosis not present

## 2020-08-19 DIAGNOSIS — O283 Abnormal ultrasonic finding on antenatal screening of mother: Secondary | ICD-10-CM | POA: Diagnosis not present

## 2020-09-11 NOTE — L&D Delivery Note (Signed)
LABOR COURSE Patient was admitted for vaginal bleeding with clots at term and in the setting of early labor. She was AROM'd at 9.5cm.   Delivery Note Called to room and patient was complete and pushing. Head delivered Direct OA. No nuchal cord present. Shoulder and body delivered in usual fashion. At 1136 a viable female was delivered via Vaginal, Spontaneous (Presentation: Direct OA, no spontaneous restitution).  Infant with spontaneous cry, placed on mother's abdomen, dried and stimulated. Cord clamped x 2 after one-minute delay, and cut by CNM with patient's permission. Cord blood drawn. Placenta delivered spontaneously with gentle cord traction. Appears intact. Fundus firm with massage and Pitocin. Labia, perineum, vagina, and cervix inspected. No lacerations or abrasions noted.  APGAR:9, 9; weight: 3657g.    Anesthesia: None  Episiotomy: None Lacerations: None Q Blood Loss (mL): 150 mL  Mom to postpartum.  Baby to Couplet care / Skin to Skin.  Clayton Bibles, CNM 11/30/20 1:47 PM

## 2020-09-16 DIAGNOSIS — M21939 Unspecified acquired deformity of unspecified forearm: Secondary | ICD-10-CM | POA: Diagnosis not present

## 2020-09-16 DIAGNOSIS — Z3483 Encounter for supervision of other normal pregnancy, third trimester: Secondary | ICD-10-CM | POA: Diagnosis not present

## 2020-09-16 DIAGNOSIS — Z23 Encounter for immunization: Secondary | ICD-10-CM | POA: Diagnosis not present

## 2020-09-16 DIAGNOSIS — B181 Chronic viral hepatitis B without delta-agent: Secondary | ICD-10-CM | POA: Diagnosis not present

## 2020-09-16 DIAGNOSIS — N9089 Other specified noninflammatory disorders of vulva and perineum: Secondary | ICD-10-CM | POA: Diagnosis not present

## 2020-09-16 DIAGNOSIS — Z789 Other specified health status: Secondary | ICD-10-CM | POA: Diagnosis not present

## 2020-09-16 DIAGNOSIS — O99019 Anemia complicating pregnancy, unspecified trimester: Secondary | ICD-10-CM | POA: Diagnosis not present

## 2020-09-16 DIAGNOSIS — Z8616 Personal history of COVID-19: Secondary | ICD-10-CM | POA: Diagnosis not present

## 2020-09-18 ENCOUNTER — Encounter (HOSPITAL_COMMUNITY): Payer: Self-pay | Admitting: Family Medicine

## 2020-09-18 ENCOUNTER — Inpatient Hospital Stay (HOSPITAL_COMMUNITY)
Admission: AD | Admit: 2020-09-18 | Discharge: 2020-09-18 | Disposition: A | Discharge status: Home / Self Care | Payer: Medicaid Other | Attending: Family Medicine | Admitting: Family Medicine

## 2020-09-18 ENCOUNTER — Other Ambulatory Visit: Payer: Self-pay

## 2020-09-18 ENCOUNTER — Inpatient Hospital Stay (HOSPITAL_COMMUNITY): Payer: Medicaid Other

## 2020-09-18 DIAGNOSIS — O368190 Decreased fetal movements, unspecified trimester, not applicable or unspecified: Secondary | ICD-10-CM

## 2020-09-18 DIAGNOSIS — Z3A29 29 weeks gestation of pregnancy: Secondary | ICD-10-CM | POA: Diagnosis not present

## 2020-09-18 DIAGNOSIS — O368130 Decreased fetal movements, third trimester, not applicable or unspecified: Secondary | ICD-10-CM | POA: Diagnosis not present

## 2020-09-18 NOTE — Discharge Instructions (Signed)
Fetal Movement Counts Patient Name: ________________________________________________ Patient Due Date: ____________________ What is a fetal movement count?  A fetal movement count is the number of times that you feel your baby move during a certain amount of time. This may also be called a fetal kick count. A fetal movement count is recommended for every pregnant woman. You may be asked to start counting fetal movements as early as week 28 of your pregnancy. Pay attention to when your baby is most active. You may notice your baby's sleep and wake cycles. You may also notice things that make your baby move more. You should do a fetal movement count:  When your baby is normally most active.  At the same time each day. A good time to count movements is while you are resting, after having something to eat and drink. How do I count fetal movements? 1. Find a quiet, comfortable area. Sit, or lie down on your side. 2. Write down the date, the start time and stop time, and the number of movements that you felt between those two times. Take this information with you to your health care visits. 3. Write down your start time when you feel the first movement. 4. Count kicks, flutters, swishes, rolls, and jabs. You should feel at least 10 movements. 5. You may stop counting after you have felt 10 movements, or if you have been counting for 2 hours. Write down the stop time. 6. If you do not feel 10 movements in 2 hours, contact your health care provider for further instructions. Your health care provider may want to do additional tests to assess your baby's well-being. Contact a health care provider if:  You feel fewer than 10 movements in 2 hours.  Your baby is not moving like he or she usually does. Date: ____________ Start time: ____________ Stop time: ____________ Movements: ____________ Date: ____________ Start time: ____________ Stop time: ____________ Movements: ____________ Date: ____________  Start time: ____________ Stop time: ____________ Movements: ____________ Date: ____________ Start time: ____________ Stop time: ____________ Movements: ____________ Date: ____________ Start time: ____________ Stop time: ____________ Movements: ____________ Date: ____________ Start time: ____________ Stop time: ____________ Movements: ____________ Date: ____________ Start time: ____________ Stop time: ____________ Movements: ____________ Date: ____________ Start time: ____________ Stop time: ____________ Movements: ____________ Date: ____________ Start time: ____________ Stop time: ____________ Movements: ____________ This information is not intended to replace advice given to you by your health care provider. Make sure you discuss any questions you have with your health care provider. Document Revised: 04/17/2019 Document Reviewed: 04/17/2019 Elsevier Patient Education  2020 Elsevier Inc.  

## 2020-09-18 NOTE — MAU Note (Signed)
Pt reports to mau with c/o DFM for the past 3 days.  Pt denies LOF, CTX or vag bleeding.

## 2020-09-18 NOTE — MAU Provider Note (Signed)
  History     CSN: [id number]  Arrival date and time: 09/18/20 1110   Event Date/Time   First Provider Initiated Contact with Patient 09/18/20 1145      No chief complaint on file.  HPI This is a 30yo G3P2002 at [redacted]w[redacted]d who is seen for decreased fetal movement since Thursday. She is eating ok and drinking ok, but no fetal movements. She also has not been sleeping well.  OB History    Gravida  3   Para  2   Term  2   Preterm  0   AB  0   Living  2     SAB  0   IAB  0   Ectopic  0   Multiple      Live Births              Past Medical History:  Diagnosis Date  . Auditory complaints of both ears 01/01/2018  . Caries 12/05/2018  . Hepatitis    Hep B positive  . Refugee health examination 07/24/2017  . 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 NO PAST SURGERIES      No family history on file.  Social History   Tobacco Use  . Smoking status: Never Smoker  . Smokeless tobacco: Never Used  Vaping Use  . Vaping Use: Never used  Substance Use Topics  . Alcohol use: No  . Drug use: No    Allergies: No Known Allergies  Medications Prior to Admission  Medication Sig Dispense Refill Last Dose  . Doxylamine-Pyridoxine 10-10 MG TBEC Take 2 tablets by mouth at bedtime. 60 tablet 0   . Magnesium 200 MG TABS Take 2 tablets (400 mg total) by mouth at bedtime. 30 tablet 1     Review of Systems Physical Exam   Blood pressure 117/66, pulse 80, temperature 98.3 F (36.8 C), temperature source Oral, resp. rate 17, last menstrual period 02/24/2020, SpO2 100 %.  Physical Exam Vitals reviewed.  Constitutional:      Appearance: Normal appearance.  Cardiovascular:     Rate and Rhythm: Normal rate.     Pulses: Normal pulses.  Pulmonary:     Effort: Pulmonary effort is normal.  Abdominal:     General: Abdomen is flat. There is no distension.     Palpations: Abdomen is soft. There is no mass.      Tenderness: There is no abdominal tenderness. There is no guarding.     Hernia: No hernia is present.  Skin:    General: Skin is warm.     Capillary Refill: Capillary refill takes less than 2 seconds.  Neurological:     General: No focal deficit present.     Mental Status: She is alert.  Psychiatric:        Mood and Affect: Mood normal.        Behavior: Behavior normal.        Thought Content: Thought content normal.        Judgment: Judgment normal.     MAU Course  Procedures NST - baseline 130s, moderate variability, 10x10s, no decels. Multiple fetal movements felt  MDM    Assessment and Plan  1. 29 weeks 2. Decreased fetal movements  Reassuring FHT. As baby started to move, no [place] needed. F/u outpatient.   Levie Heritage 09/18/2020, 11:45 AM

## 2020-09-20 DIAGNOSIS — Z3483 Encounter for supervision of other normal pregnancy, third trimester: Secondary | ICD-10-CM | POA: Diagnosis not present

## 2020-09-20 DIAGNOSIS — O99810 Abnormal glucose complicating pregnancy: Secondary | ICD-10-CM | POA: Diagnosis not present

## 2020-09-29 DIAGNOSIS — Z3493 Encounter for supervision of normal pregnancy, unspecified, third trimester: Secondary | ICD-10-CM | POA: Diagnosis not present

## 2020-09-29 DIAGNOSIS — B170 Acute delta-(super) infection of hepatitis B carrier: Secondary | ICD-10-CM | POA: Diagnosis not present

## 2020-09-29 DIAGNOSIS — Z7185 Encounter for immunization safety counseling: Secondary | ICD-10-CM | POA: Diagnosis not present

## 2020-09-29 DIAGNOSIS — B180 Chronic viral hepatitis B with delta-agent: Secondary | ICD-10-CM | POA: Diagnosis not present

## 2020-09-29 DIAGNOSIS — B181 Chronic viral hepatitis B without delta-agent: Secondary | ICD-10-CM | POA: Diagnosis not present

## 2020-10-11 ENCOUNTER — Telehealth: Payer: Self-pay

## 2020-10-11 NOTE — Telephone Encounter (Signed)
RCID Patient Product/process development scientist completed.    The patient is insured through [employer] MD and has a 3.00 copay.  Viread generic is covered.  We will continue to follow to see if copay assistance is needed.  Clearance Coots, CPhT Specialty Pharmacy Patient [facility] Phone: [phone] Fax:  [phone]

## 2020-10-12 ENCOUNTER — Encounter: Payer: Self-pay | Admitting: Infectious Disease

## 2020-10-12 ENCOUNTER — Other Ambulatory Visit: Payer: Self-pay

## 2020-10-12 ENCOUNTER — Ambulatory Visit (INDEPENDENT_AMBULATORY_CARE_PROVIDER_SITE_OTHER): Payer: Medicaid Other | Admitting: Infectious Disease

## 2020-10-12 VITALS — BP 107/72 | HR 90 | Temp 98.0°F | Ht 66.0 in | Wt 180.0 lb

## 2020-10-12 DIAGNOSIS — B181 Chronic viral hepatitis B without delta-agent: Secondary | ICD-10-CM | POA: Diagnosis not present

## 2020-10-12 DIAGNOSIS — B180 Chronic viral hepatitis B with delta-agent: Secondary | ICD-10-CM | POA: Insufficient documentation

## 2020-10-12 DIAGNOSIS — Z8616 Personal history of COVID-19: Secondary | ICD-10-CM

## 2020-10-12 DIAGNOSIS — Z3493 Encounter for supervision of normal pregnancy, unspecified, third trimester: Secondary | ICD-10-CM

## 2020-10-12 DIAGNOSIS — O98413 Viral hepatitis complicating pregnancy, third trimester: Secondary | ICD-10-CM | POA: Diagnosis not present

## 2020-10-12 HISTORY — DX: Chronic viral hepatitis B without delta-agent: B18.1

## 2020-10-12 NOTE — Progress Notes (Signed)
Subjective:  Reason for infectious disease consult: Third trimester pregnancy with hepatitis B  Requesting Physician: Acquanetta Belling, MD    Patient ID: Jill Huff, female    DOB: Jun 15, 1991, 30 y.o.   MRN: [id number]  HPI  Jill Huff is a 30 year old lady who immigrated from [place], speaks Swahili only.  She was referred to [place] because she was found to have hepatitis B during her obstetric care.  Hepatitis B DNA was roughly 22,000 copies October.  LFTs were relatively normal.  Apparently and I learned this at the end of the visit she had been seen by [facility] and they had done blood work as well.  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  Her obstetric team is ready to administer appropriate vaccines and preventative therapies to her baby when it is delivered.  The question right now is rather should she need treatment for hepatitis B now while she is pregnant and then the longer term issue is whether and when she might need treatment in the future.  I suspect her viral load will remain low now and that she would not meet criteria for needing an oral antiviral for this.     Past Medical History:  Diagnosis Date  . Auditory complaints of both ears 01/01/2018  . Caries 12/05/2018  . Chronic hepatitis B without delta agent without hepatic coma (HCC) 10/12/2020  . Hepatitis    Hep B positive  . Refugee health examination 07/24/2017  . 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 NO PAST SURGERIES      No family history on file.    Social History   Socioeconomic History  . Marital status: Unknown    Spouse name: Not on file  . Number of children: Not on file  . Years of education: Not on file  . Highest education level: Not on file  Occupational History  . Not on file  Tobacco Use  . Smoking status: Never Smoker  . Smokeless tobacco: Never Used  Vaping  Use  . Vaping Use: Never used  Substance and Sexual Activity  . Alcohol use: No  . Drug use: No  . Sexual activity: Yes  Other Topics Concern  . Not on file  Social History Narrative   ** Merged History Encounter **       Social Determinants of Health   Financial Resource Strain: Not on file  Food Insecurity: Not on file  Transportation Needs: Not on file  Physical Activity: Not on file  Stress: Not on file  Social Connections: Not on file    No Known Allergies   Current Outpatient Medications:  .  Doxylamine-Pyridoxine 10-10 MG TBEC, Take 2 tablets by mouth at bedtime., Disp: 60 tablet, Rfl: 0 .  Magnesium 200 MG TABS, Take 2 tablets (400 mg total) by mouth at bedtime., Disp: 30 tablet, Rfl: 1    Review of Systems  Constitutional: Negative for chills and fever.  HENT: Negative for congestion and sore throat.   Eyes: Negative for photophobia.  Respiratory: Negative for cough, shortness of breath and wheezing.   Cardiovascular: Negative for chest pain, palpitations and leg swelling.  Gastrointestinal: Positive for nausea. Negative for abdominal pain, blood in stool, constipation, diarrhea and vomiting.  Genitourinary: Negative for dysuria, flank pain and hematuria.  Musculoskeletal: Negative for back pain and myalgias.  Skin: Negative for rash.  Neurological: Negative for dizziness, weakness and headaches.  Hematological: Does not bruise/bleed easily.  Psychiatric/Behavioral: Negative for suicidal ideas.       Objective:   Physical Exam Constitutional:      General: She is not in acute distress.    Appearance: She is well-developed and well-nourished. She is not diaphoretic.  HENT:     Head: Normocephalic and atraumatic.     Mouth/Throat:     Pharynx: No oropharyngeal exudate.  Eyes:     General: No scleral icterus.    Extraocular Movements: EOM normal.     Conjunctiva/sclera: Conjunctivae normal.  Cardiovascular:     Rate and Rhythm: Normal rate and  regular rhythm.  Pulmonary:     Effort: Pulmonary effort is normal. No respiratory distress.     Breath sounds: No wheezing.  Musculoskeletal:        General: No tenderness or edema.     Cervical back: Normal range of motion and neck supple.  Skin:    General: Skin is warm and dry.     Coloration: Skin is not pale.     Findings: No erythema or rash.  Neurological:     General: No focal deficit present.     Mental Status: She is alert and oriented to person, place, and time.     Motor: No abnormal muscle tone.     Coordination: Coordination normal.  Psychiatric:        Mood and Affect: Mood and affect and mood normal.        Behavior: Behavior normal.        Thought Content: Thought content normal.        Judgment: Judgment normal.     Gravid      Assessment & Plan:   Chronic hepatitis B without hepatic coma: We are rechecking LFTs today B delta agent hepatitis C antigen hepatitis C antibody  I suspect we will not need to initiate to viral therapy yet during this pregnancy.  We will have her come back in roughly a month's time to review labs and then I will follow up with her on a yearly basis.  When she reaches the age of 11 we will start screening for hepatocellular carcinoma.  Her husband should certainly be tested for hepatitis B and HIV as well.  Open prevention she seems to have had Covid by labs as a see one at [facility] in 2020.  She had refused vaccination which I have encouraged her to actually do.  I emphasized the risk that is much higher in pregnant women versus when they are not pregnant both to the mother and the child of COVID-19 and also influenza due to the partially immunocompromised state of pregnancy.  I spent greater than 60  minutes with the patient including greater than 50% of time in face to face counsel of the patient re HBV, COVID 19 via in person Swahili and in coordination of her care.

## 2020-10-18 LAB — COMPLETE METABOLIC PANEL WITH GFR
AG Ratio: 1.2 (calc) (ref 1.0–2.5)
ALT: 21 U/L (ref 6–29)
AST: 24 U/L (ref 10–30)
Albumin: 3.3 g/dL — ABNORMAL LOW (ref 3.6–5.1)
Alkaline phosphatase (APISO): 87 U/L (ref 31–125)
BUN/Creatinine Ratio: 8 (calc) (ref 6–22)
BUN: 4 mg/dL — ABNORMAL LOW (ref 7–25)
CO2: 25 mmol/L (ref 20–32)
Calcium: 8.7 mg/dL (ref 8.6–10.2)
Chloride: 107 mmol/L (ref 98–110)
Creat: 0.51 mg/dL (ref 0.50–1.10)
GFR, Est African American: 152 mL/min/{1.73_m2} (ref >=60)
GFR, Est Non African American: 131 mL/min/{1.73_m2} (ref >=60)
Globulin: 2.8 g/dL (calc) (ref 1.9–3.7)
Glucose, Bld: 63 mg/dL — ABNORMAL LOW (ref 65–99)
Potassium: 4.1 mmol/L (ref 3.5–5.3)
Sodium: 138 mmol/L (ref 135–146)
Total Bilirubin: 0.4 mg/dL (ref 0.2–1.2)
Total Protein: 6.1 g/dL (ref 6.1–8.1)

## 2020-10-18 LAB — HEPATITIS B DNA, ULTRAQUANTITATIVE, PCR
Hepatitis B DNA (Calc): 4.91 Log IU/mL — ABNORMAL HIGH
Hepatitis B DNA: 82200 IU/mL — ABNORMAL HIGH

## 2020-10-18 LAB — HEPATITIS B E ANTIBODY: Hep B E Ab: REACTIVE — AB

## 2020-10-18 LAB — HEPATITIS A ANTIBODY, TOTAL: Hepatitis A AB,Total: REACTIVE — AB

## 2020-10-18 LAB — HEPATITIS B E ANTIGEN: Hep B E Ag: NONREACTIVE

## 2020-10-18 LAB — HEPATITIS DELTA VIRUS ANTIGEN: Hepatitis D Antigen: NOT DETECTED

## 2020-10-19 ENCOUNTER — Telehealth: Payer: Self-pay

## 2020-10-19 NOTE — Telephone Encounter (Signed)
Called patient using [employer]; left voicemail requesting call back for additional information about her OB and to deliver test results.   Irineo Gaulin Loyola Mast, RN

## 2020-10-19 NOTE — Telephone Encounter (Signed)
-----   Message from Randall Hiss, MD sent at 10/19/2020  8:35 AM EST ----- Jill Huff's HBV DNA is higher than before but does not meet criteria for treatment with an anti-viral during pregnancy. Her child DOES need Hepatitis B immunoglobuline and HBV vaccination at birth which her Ob should know all about

## 2020-10-26 IMAGING — US US ABDOMEN LIMITED
1 series · 14 of 25 positions shown · non-contrast
Comparison: Abdominal ultrasound 11/08/2018

CLINICAL DATA: Right upper quadrant pain.  Evaluate for gallstones.

EXAM:
ULTRASOUND ABDOMEN LIMITED RIGHT UPPER QUADRANT

[Series 1: us abdomen limited · 0.15mm/px · 14 of 41 slices shown]
[im 1/41]
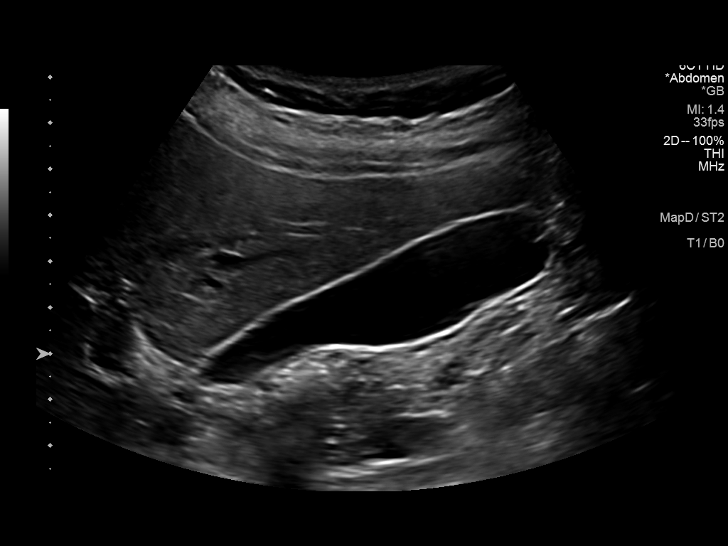
[im 4/41]
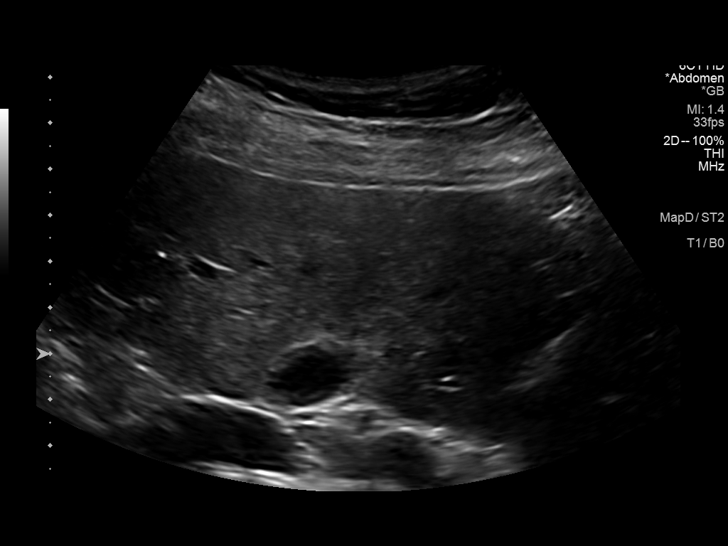
[im 7/41]
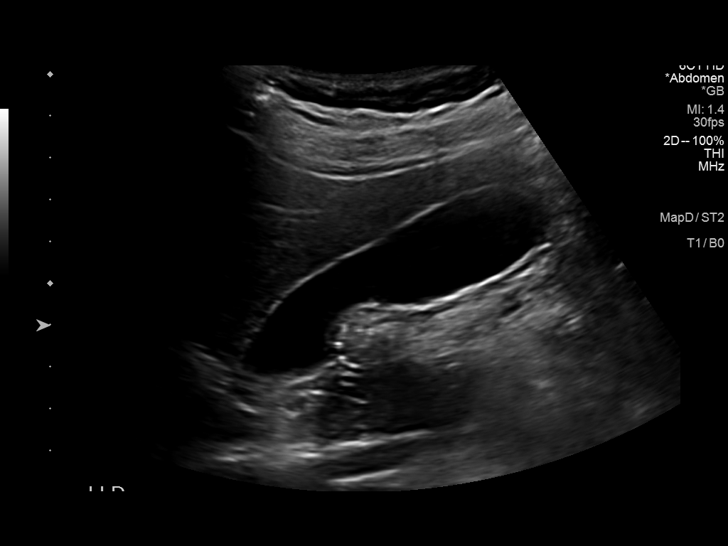
[im 11/41]
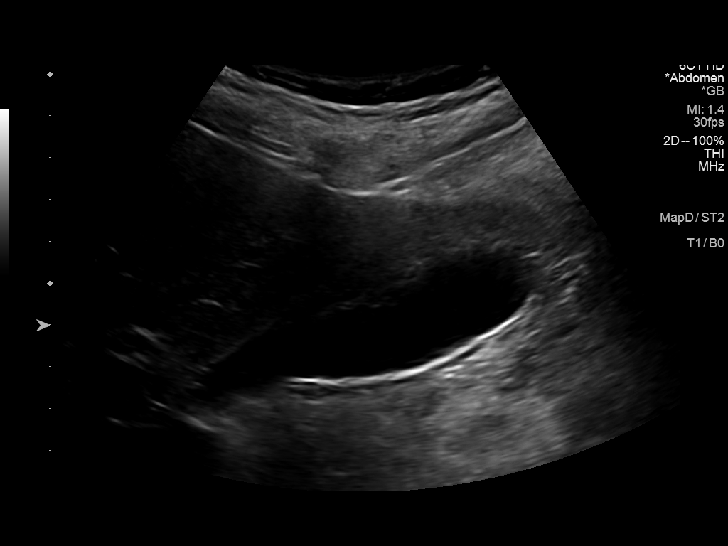
[im 14/41]
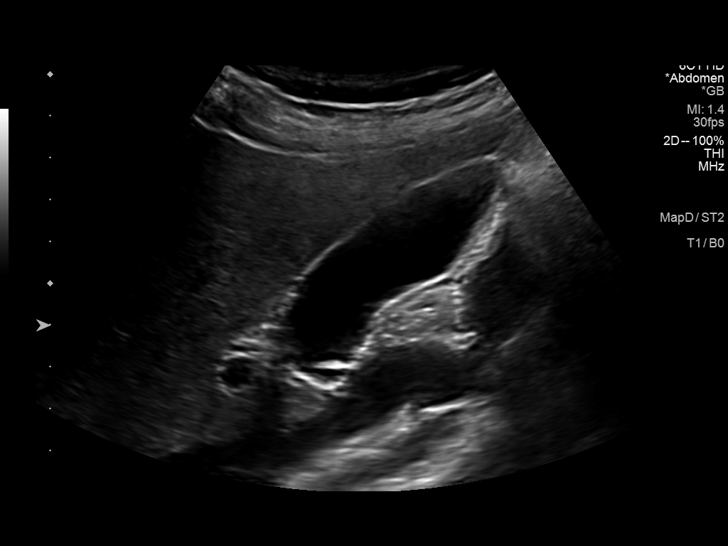
[im 16/41]
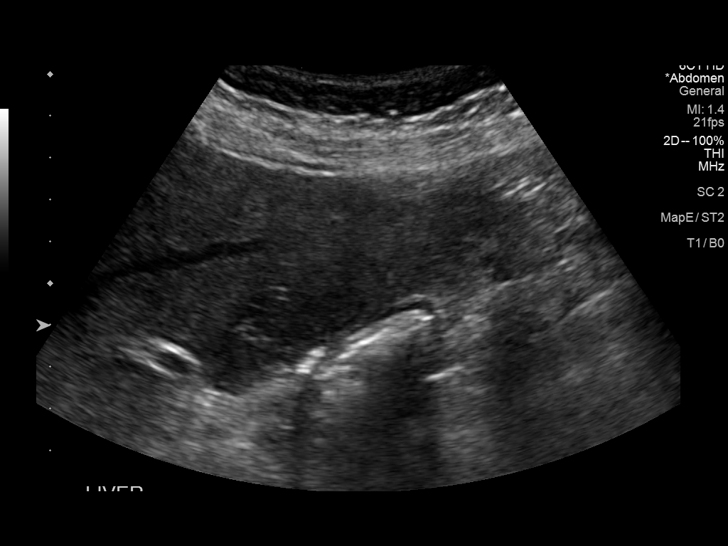
[im 19/41]
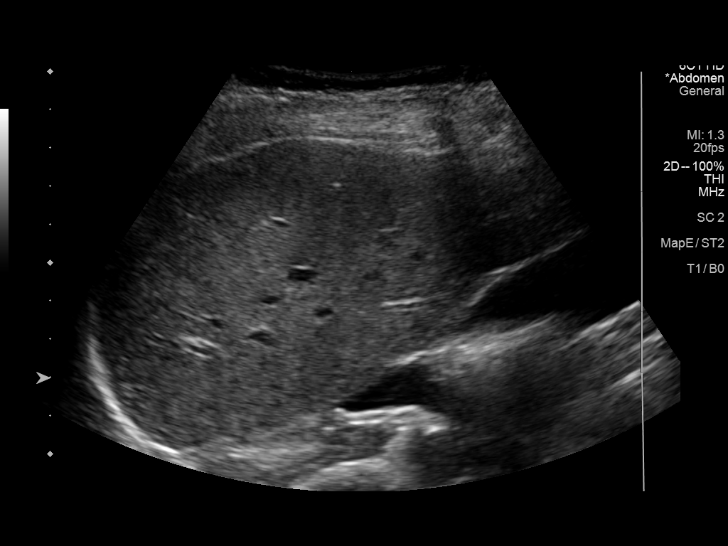
[im 22/41]
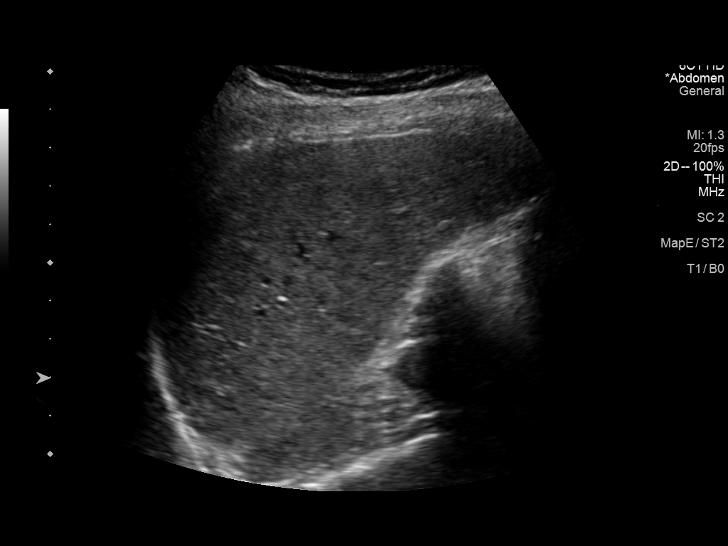
[im 26/41]
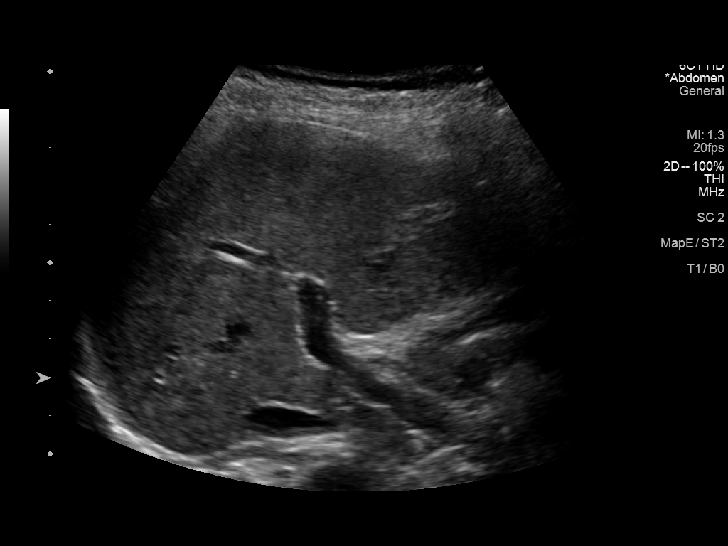
[im 27/41]
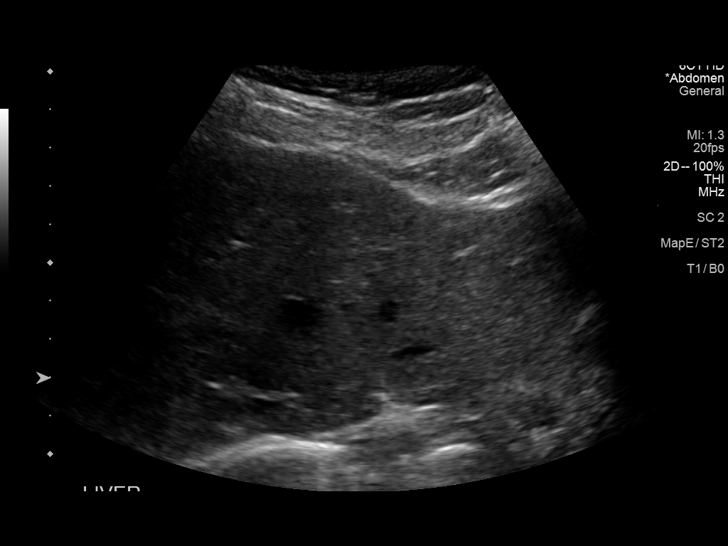
[im 31/41]
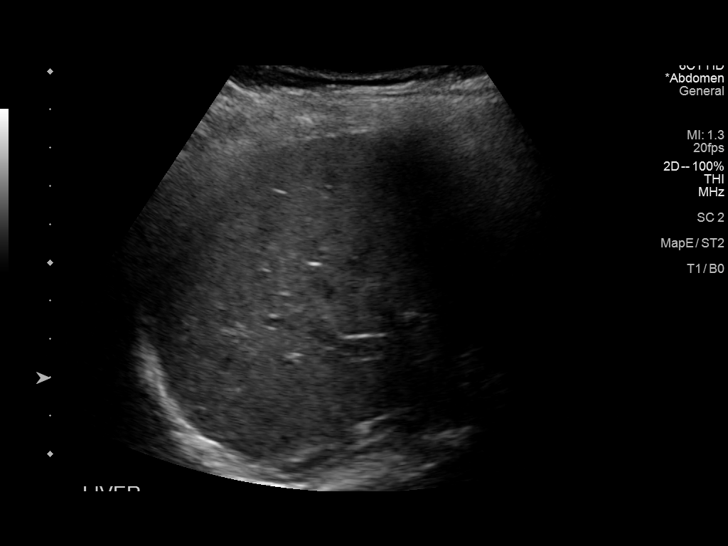
[im 34/41]
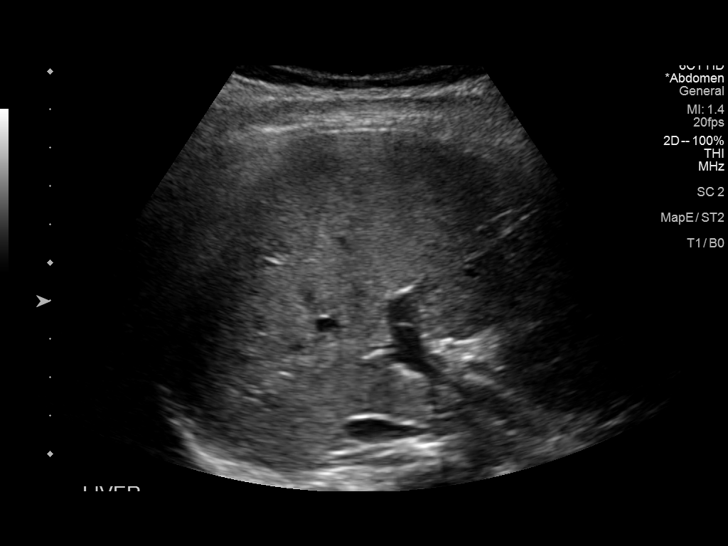
[im 37/41]
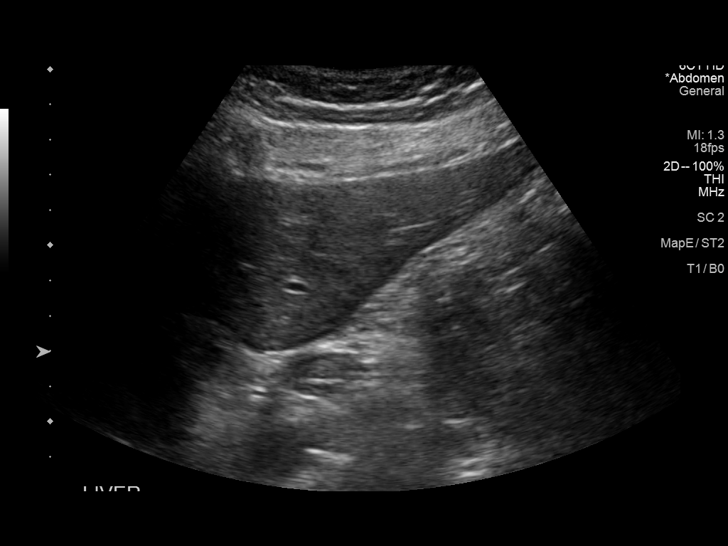
[im 41/41]
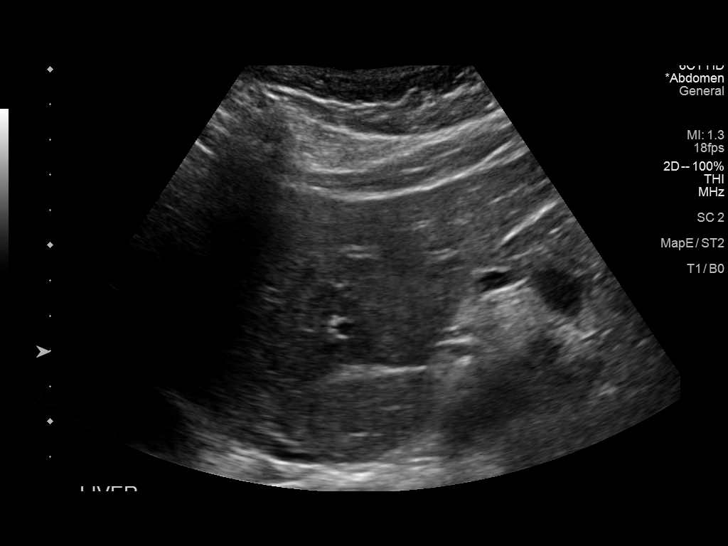

[14 of 25 positions shown; findings below may reference images not displayed]

FINDINGS: Gallbladder:

No gallstones or wall thickening visualized. No sonographic Murphy
sign noted by sonographer.

Common bile duct:

Diameter: 0.5 cm

Liver:

No focal lesion identified. Within normal limits in parenchymal
echogenicity. Portal vein is patent on color Doppler imaging with
normal direction of blood flow towards the liver.

Other: None.
IMPRESSION: Normal sonographic appearance of the gallbladder.

## 2020-10-27 NOTE — Telephone Encounter (Signed)
Have reached out to patient multiple times and left voicemail to return call. Patient has upcoming appointment on 2/22

## 2020-11-02 ENCOUNTER — Ambulatory Visit: Payer: Medicaid Other | Admitting: Infectious Disease

## 2020-11-02 NOTE — Telephone Encounter (Signed)
Thanks Shaquenia!

## 2020-11-08 DIAGNOSIS — Z8616 Personal history of COVID-19: Secondary | ICD-10-CM | POA: Diagnosis not present

## 2020-11-08 DIAGNOSIS — Z789 Other specified health status: Secondary | ICD-10-CM | POA: Diagnosis not present

## 2020-11-08 DIAGNOSIS — B181 Chronic viral hepatitis B without delta-agent: Secondary | ICD-10-CM | POA: Diagnosis not present

## 2020-11-08 DIAGNOSIS — M21939 Unspecified acquired deformity of unspecified forearm: Secondary | ICD-10-CM | POA: Diagnosis not present

## 2020-11-08 DIAGNOSIS — O99019 Anemia complicating pregnancy, unspecified trimester: Secondary | ICD-10-CM | POA: Diagnosis not present

## 2020-11-08 DIAGNOSIS — Z3483 Encounter for supervision of other normal pregnancy, third trimester: Secondary | ICD-10-CM | POA: Diagnosis not present

## 2020-11-08 DIAGNOSIS — N9089 Other specified noninflammatory disorders of vulva and perineum: Secondary | ICD-10-CM | POA: Diagnosis not present

## 2020-11-08 DIAGNOSIS — Z23 Encounter for immunization: Secondary | ICD-10-CM | POA: Diagnosis not present

## 2020-11-08 DIAGNOSIS — O99810 Abnormal glucose complicating pregnancy: Secondary | ICD-10-CM | POA: Diagnosis not present

## 2020-11-08 LAB — OB RESULTS CONSOLE GC/CHLAMYDIA: Gonorrhea: NEGATIVE

## 2020-11-08 LAB — OB RESULTS CONSOLE GBS: GBS: NEGATIVE

## 2020-11-16 ENCOUNTER — Ambulatory Visit (INDEPENDENT_AMBULATORY_CARE_PROVIDER_SITE_OTHER): Payer: Medicaid Other | Admitting: Infectious Diseases

## 2020-11-16 ENCOUNTER — Other Ambulatory Visit: Payer: Self-pay

## 2020-11-16 ENCOUNTER — Encounter: Payer: Self-pay | Admitting: Infectious Diseases

## 2020-11-16 VITALS — BP 102/69 | HR 71 | Temp 97.6°F | Wt 180.0 lb

## 2020-11-16 DIAGNOSIS — Z7289 Other problems related to lifestyle: Secondary | ICD-10-CM

## 2020-11-16 DIAGNOSIS — Z789 Other specified health status: Secondary | ICD-10-CM | POA: Diagnosis not present

## 2020-11-16 DIAGNOSIS — B181 Chronic viral hepatitis B without delta-agent: Secondary | ICD-10-CM | POA: Diagnosis not present

## 2020-11-16 DIAGNOSIS — O09523 Supervision of elderly multigravida, third trimester: Secondary | ICD-10-CM | POA: Diagnosis not present

## 2020-11-16 DIAGNOSIS — F1090 Alcohol use, unspecified, uncomplicated: Secondary | ICD-10-CM

## 2020-11-16 NOTE — Telephone Encounter (Signed)
Patient here for visit with Dr. Elinor Parkinson, RN relayed per Dr. Daiva Eves via Swahili interpreter that her baby will need hepatitis B immunoglobulin and hepatitis B vaccination at birth. Advised her that her OB should be able to walk her through this process. Patient verbalized understanding and has no further questions.   Sandie Ano, RN

## 2020-11-16 NOTE — Progress Notes (Signed)
[facility]                                                             [place], [place], [place], [address]                                                                  Phn. [phone]; Fax: [phone]                                                                             Date: 11/16/20  Reason for Follow up: Hepatitis B and pregnancy   Assessment # Chronic hepatitis B in pregnancy ( third trimester) 10/12/20 Hep B E ab reactive, Hep B E ag NR, Hep B DNA 82,200, hep D ag not detected 04/12/20 HIV NR  # Health Maintenance Immune to hepatitis A  # Alcohol use - counseled   Plan Fu with OB - discussed about the need for Hep B vaccine and immunoglobulins for the new born Fu in 3 months after delivery for monitoring of labs/need for treatment of hepatitis B Will also plan to check Hepatitis C as a screening in follow up visit  All questions and concerns were discussed and addressed. Patient verbalized understanding of the plan. ____________________________________________________________________________________________________________________  HPI: 5 Y O Pregnant Female in third trimester who is here for follow up for Hepatitis B Infection. Patient is previously seen by Dr Tommy Medal and had blood work done for Hepatitis B. She was deemed to have not met the criteria for treatment of Hepatitis B.She missed her follow up appointment on 2/22 for discussion of lab results and was rescheduled with me.   Spoke with patient with the help of in person interpreter. Patient is not so communicative and mostly answers in a yes/no answer. She says she came to [place] from [place] approx 3 and half years ago. She says she knew about her Hepatitis B positive after arriving in the [place] but has never received any form of tx for it. Denies any liver disease in herself or her family. Denies any h/o liver  cancer in the family.   Says she has a female partner and 2 children ( 4 years and 3 and half years). Did not enquire much about her sexual h/o given language and cultural barriers. Currently pregnant with her 3rd child. Says her husband and children were tested for hepatitis B and they were negative.   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   Following up with OB. My RN Jinny Blossom also discussed with her need of Hep B vaccine and Immunoglobulin for the newborn. I also discussed with her that her labs currently do not indicate need for hep  B treatment and she will need to follow up with [place] for if/when she needs to be treated.   She says baby is well and she can feel the movements as usual.   ROS: Constitutional: Negative for fever, chills, activity change, appetite change, fatigue and unexpected weight change.  HENT: Negative for congestion, sore throat, rhinorrhea, sneezing, trouble swallowing and sinus pressure.  Eyes: Negative for photophobia and visual disturbance.  Respiratory: Negative for cough, chest tightness, shortness of breath, wheezing and stridor.  Cardiovascular: Negative for chest pain, palpitations and leg swelling.  Gastrointestinal: Negative for nausea, vomiting, abdominal pain, diarrhea, constipation, blood in stool, abdominal distention and anal bleeding.  Genitourinary: Negative for dysuria, hematuria, flank pain and difficulty urinating.  Musculoskeletal: Negative for myalgias, back pain, joint swelling, arthralgias and gait problem.  Skin: Negative for color change, pallor, rash and wound.  Neurological: Negative for dizziness, tremors, weakness and light-headedness.  Hematological: Negative for adenopathy. Does not bruise/bleed easily.  Psychiatric/Behavioral: Negative for behavioral problems, confusion, sleep disturbance, dysphoric mood, decreased concentration and agitation.   Past Medical History:  Diagnosis Date  .  Auditory complaints of both ears 01/01/2018  . Caries 12/05/2018  . Chronic hepatitis B without delta agent without hepatic coma ([facility]) 10/12/2020  . Hepatitis    Hep B positive  . Refugee health examination 07/24/2017  . 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 NO PAST SURGERIES     Current Outpatient Medications on File Prior to Visit  Medication Sig Dispense Refill  . Prenatal Vit-Fe Fumarate-FA (WESTAB PLUS) 27-1 MG TABS Take 1 tablet by mouth daily.    . Doxylamine-Pyridoxine 10-10 MG TBEC Take 2 tablets by mouth at bedtime. (Patient not taking: Reported on 11/16/2020) 60 tablet 0  . Magnesium 200 MG TABS Take 2 tablets (400 mg total) by mouth at bedtime. (Patient not taking: Reported on 11/16/2020) 30 tablet 1  . [DISCONTINUED] Cetirizine HCl 10 MG CAPS Take 1 capsule (10 mg total) by mouth daily. (Patient not taking: Reported on 04/01/2020) 15 capsule 0  . [DISCONTINUED] fluticasone (FLONASE) 50 MCG/ACT nasal spray Place 2 sprays into both nostrils daily. (Patient not taking: Reported on 04/01/2020) 1 g 0   No current facility-administered medications on file prior to visit.   No Known Allergies  Social History   Socioeconomic History  . Marital status: Unknown    Spouse name: Not on file  . Number of children: Not on file  . Years of education: Not on file  . Highest education level: Not on file  Occupational History  . Not on file  Tobacco Use  . Smoking status: Never Smoker  . Smokeless tobacco: Never Used  Vaping Use  . Vaping Use: Never used  Substance and Sexual Activity  . Alcohol use: Yes    Comment: 1 bottle of wine per week  . Drug use: No  . Sexual activity: Yes  Other Topics Concern  . Not on file  Social History Narrative   ** Merged History Encounter **       Social Determinants of Health   Financial Resource Strain: Not on file  Food Insecurity: Not on file  Transportation Needs: Not on  file  Physical Activity: Not on file  Stress: Not on file  Social Connections: Not on file  Intimate Partner Violence: Not on file   Vitals BP 102/69   Pulse 71   Temp 97.6 F (36.4 C) (Oral)  Wt 180 lb (81.6 kg)   LMP 02/24/2020   SpO2 100%   BMI 29.05 kg/m   Examination  General - not in acute distress, comfortably sitting in chair HEENT - PEERLA, no pallor and no icterus Chest - b/l clear air entry, no additional sounds, NO THRUSH  CVS- Normal s1s2, RRR Abdomen - GRAVID ABDOMEN Ext- no pedal edema Neuro: grossly normal Back - WNL Psych : calm and cooperative  Recent labs CBC Latest Ref Rng & Units 04/12/2020 12/03/2018 11/08/2017  WBC 4.0 - 10.5 K/uL 6.5 5.7 9.3  Hemoglobin 12.0 - 15.0 g/dL 13.5 14.1 12.3  Hematocrit 36.0 - 46.0 % 42.4 43.9 37.5  Platelets 150 - 400 K/uL 190 187 126(L)   CMP Latest Ref Rng & Units 10/12/2020 06/30/2019 12/03/2018  Glucose 65 - 99 mg/dL 63(L) 94 86  BUN 7 - 25 mg/dL 4(L) 7 11  Creatinine 0.50 - 1.10 mg/dL 0.51 0.71 0.73  Sodium 135 - 146 mmol/L 138 138 139  Potassium 3.5 - 5.3 mmol/L 4.1 4.1 4.3  Chloride 98 - 110 mmol/L 107 105 105  CO2 20 - 32 mmol/L _0 Calcium 8.6 - 10.2 mg/dL 8.7 9.4 9.8  Total Protein 6.1 - 8.1 g/dL 6.1 7.0 7.3  Total Bilirubin 0.2 - 1.2 mg/dL 0.4 0.3 0.5  Alkaline Phos 39 - 117 IU/L - 76 60  AST 10 - 30 U/L _1 ALT 6 - 29 U/L _2 Pertinent Microbiology Results for orders placed or performed during the hospital encounter of 04/12/20  Wet prep, genital     Status: Abnormal   Collection Time: 04/12/20 10:36 AM   Specimen: PATH Cytology Cervicovaginal Ancillary Only  Result Value Ref Range Status   Yeast Wet Prep HPF POC NONE SEEN NONE SEEN Final   Trich, Wet Prep NONE SEEN NONE SEEN Final   Clue Cells Wet Prep HPF POC NONE SEEN NONE SEEN Final   WBC, Wet Prep HPF POC FEW (A) NONE SEEN Final   Sperm NONE SEEN  Final    Comment: Performed at [facility], [address],  [place], [place] [address]    Pertinent ImaginG  All pertinent labs/Imagings/notes reviewed. All pertinent plain films and CT images have been personally visualized and interpreted; radiology reports have been reviewed. Decision making incorporated into the Impression / Recommendations.  I spent 30 minutes with the patient including  review of prior medical records with greater than 50% of time in face to face counsel of the patient.    Electronically signed by:  Rosiland Oz, MD Infectious Disease Physician [facility] [address] [place], [place] [address] Phone: [phone]  Fax: [phone]

## 2020-11-30 ENCOUNTER — Inpatient Hospital Stay (HOSPITAL_COMMUNITY)
Admission: AD | Admit: 2020-11-30 | Discharge: 2020-12-02 | DRG: 806 | Disposition: A | Discharge status: Home / Self Care | Payer: Medicaid Other | Attending: Family Medicine | Admitting: Family Medicine

## 2020-11-30 ENCOUNTER — Encounter (HOSPITAL_COMMUNITY): Payer: Self-pay | Admitting: Obstetrics and Gynecology

## 2020-11-30 ENCOUNTER — Other Ambulatory Visit: Payer: Self-pay

## 2020-11-30 DIAGNOSIS — O4690 Antepartum hemorrhage, unspecified, unspecified trimester: Secondary | ICD-10-CM | POA: Diagnosis present

## 2020-11-30 DIAGNOSIS — Z20822 Contact with and (suspected) exposure to covid-19: Secondary | ICD-10-CM | POA: Diagnosis not present

## 2020-11-30 DIAGNOSIS — B181 Chronic viral hepatitis B without delta-agent: Secondary | ICD-10-CM | POA: Diagnosis not present

## 2020-11-30 DIAGNOSIS — B180 Chronic viral hepatitis B with delta-agent: Secondary | ICD-10-CM | POA: Diagnosis present

## 2020-11-30 DIAGNOSIS — Z3A40 40 weeks gestation of pregnancy: Secondary | ICD-10-CM

## 2020-11-30 DIAGNOSIS — Z3490 Encounter for supervision of normal pregnancy, unspecified, unspecified trimester: Secondary | ICD-10-CM

## 2020-11-30 DIAGNOSIS — O26893 Other specified pregnancy related conditions, third trimester: Secondary | ICD-10-CM | POA: Diagnosis not present

## 2020-11-30 DIAGNOSIS — Z789 Other specified health status: Secondary | ICD-10-CM | POA: Diagnosis present

## 2020-11-30 DIAGNOSIS — O9842 Viral hepatitis complicating childbirth: Secondary | ICD-10-CM | POA: Diagnosis not present

## 2020-11-30 DIAGNOSIS — R58 Hemorrhage, not elsewhere classified: Secondary | ICD-10-CM | POA: Diagnosis not present

## 2020-11-30 DIAGNOSIS — O200 Threatened abortion: Secondary | ICD-10-CM | POA: Diagnosis not present

## 2020-11-30 LAB — RPR: RPR Ser Ql: NONREACTIVE

## 2020-11-30 LAB — CBC
HCT: 38.8 % (ref 36.0–46.0)
Hemoglobin: 12.7 g/dL (ref 12.0–15.0)
MCH: 26.6 pg (ref 26.0–34.0)
MCHC: 32.7 g/dL (ref 30.0–36.0)
MCV: 81.2 fL (ref 80.0–100.0)
Platelets: 133 10*3/uL — ABNORMAL LOW (ref 150–400)
RBC: 4.78 MIL/uL (ref 3.87–5.11)
RDW: 14.6 % (ref 11.5–15.5)
WBC: 5.9 10*3/uL (ref 4.0–10.5)
nRBC: 0 % (ref 0.0–0.2)

## 2020-11-30 LAB — RESP PANEL BY RT-PCR (FLU A&B, COVID) ARPGX2
Influenza A by PCR: NEGATIVE
Influenza B by PCR: NEGATIVE
SARS Coronavirus 2 by RT PCR: NEGATIVE

## 2020-11-30 MED ORDER — COCONUT OIL OIL: 1.0000 "application " | TOPICAL_OIL | Status: DC | PRN

## 2020-11-30 MED ORDER — ONDANSETRON HCL 4 MG PO TABS: 4.0000 mg | ORAL_TABLET | ORAL | Status: DC | PRN

## 2020-11-30 MED ORDER — ACETAMINOPHEN 325 MG PO TABS: 650.0000 mg | ORAL_TABLET | ORAL | Status: DC | PRN

## 2020-11-30 MED ORDER — WITCH HAZEL-GLYCERIN EX PADS: 1.0000 "application " | MEDICATED_PAD | CUTANEOUS | Status: DC | PRN

## 2020-11-30 MED ORDER — TERBUTALINE SULFATE 1 MG/ML IJ SOLN: 0.2500 mg | Freq: Once | INTRAMUSCULAR | Status: DC | PRN

## 2020-11-30 MED ORDER — LACTATED RINGERS IV SOLN
500.0000 mL | INTRAVENOUS | Status: DC | PRN
  Administered 2020-11-30: 1000 mL via INTRAVENOUS

## 2020-11-30 MED ORDER — LIDOCAINE HCL (PF) 1 % IJ SOLN: 30.0000 mL | INTRAMUSCULAR | Status: DC | PRN

## 2020-11-30 MED ORDER — ONDANSETRON HCL 4 MG/2ML IJ SOLN: 4.0000 mg | Freq: Four times a day (QID) | INTRAMUSCULAR | Status: DC | PRN

## 2020-11-30 MED ORDER — PRENATAL MULTIVITAMIN CH
1.0000 | ORAL_TABLET | Freq: Every day | ORAL | Status: DC
Start: 2020-11-30 — End: 2020-12-02
  Administered 2020-12-01 – 2020-12-02 (×2): 1 via ORAL
  Filled 2020-11-30 (×2): qty 1

## 2020-11-30 MED ORDER — SIMETHICONE 80 MG PO CHEW: 80.0000 mg | CHEWABLE_TABLET | ORAL | Status: DC | PRN

## 2020-11-30 MED ORDER — OXYCODONE-ACETAMINOPHEN 5-325 MG PO TABS: 2.0000 | ORAL_TABLET | ORAL | Status: DC | PRN

## 2020-11-30 MED ORDER — DIBUCAINE (PERIANAL) 1 % EX OINT: 1.0000 "application " | TOPICAL_OINTMENT | CUTANEOUS | Status: DC | PRN

## 2020-11-30 MED ORDER — MAGNESIUM HYDROXIDE 400 MG/5ML PO SUSP: 30.0000 mL | ORAL | Status: DC | PRN

## 2020-11-30 MED ORDER — OXYCODONE-ACETAMINOPHEN 5-325 MG PO TABS: 1.0000 | ORAL_TABLET | ORAL | Status: DC | PRN

## 2020-11-30 MED ORDER — LACTATED RINGERS IV SOLN: INTRAVENOUS | Status: DC

## 2020-11-30 MED ORDER — IBUPROFEN 600 MG PO TABS
600.0000 mg | ORAL_TABLET | Freq: Four times a day (QID) | ORAL | Status: DC
  Administered 2020-11-30 – 2020-12-02 (×8): 600 mg via ORAL
  Filled 2020-11-30 (×8): qty 1

## 2020-11-30 MED ORDER — OXYTOCIN-SODIUM CHLORIDE 30-0.9 UT/500ML-% IV SOLN
1.0000 m[IU]/min | INTRAVENOUS | Status: DC
  Administered 2020-11-30: 2 m[IU]/min via INTRAVENOUS
  Filled 2020-11-30: qty 500

## 2020-11-30 MED ORDER — DIPHENHYDRAMINE HCL 25 MG PO CAPS: 25.0000 mg | ORAL_CAPSULE | Freq: Four times a day (QID) | ORAL | Status: DC | PRN

## 2020-11-30 MED ORDER — TETANUS-DIPHTH-ACELL PERTUSSIS 5-2.5-18.5 LF-MCG/0.5 IM SUSY: 0.5000 mL | PREFILLED_SYRINGE | Freq: Once | INTRAMUSCULAR | Status: DC

## 2020-11-30 MED ORDER — ONDANSETRON HCL 4 MG/2ML IJ SOLN: 4.0000 mg | INTRAMUSCULAR | Status: DC | PRN

## 2020-11-30 MED ORDER — SOD CITRATE-CITRIC ACID 500-334 MG/5ML PO SOLN: 30.0000 mL | ORAL | Status: DC | PRN

## 2020-11-30 MED ORDER — BENZOCAINE-MENTHOL 20-0.5 % EX AERO: 1.0000 "application " | INHALATION_SPRAY | CUTANEOUS | Status: DC | PRN

## 2020-11-30 MED ORDER — ACETAMINOPHEN 325 MG PO TABS
650.0000 mg | ORAL_TABLET | ORAL | Status: DC | PRN
  Administered 2020-11-30: 650 mg via ORAL
  Filled 2020-11-30: qty 2

## 2020-11-30 MED ORDER — OXYTOCIN BOLUS FROM INFUSION
333.0000 mL | Freq: Once | INTRAVENOUS | Status: AC
Start: 2020-11-30 — End: 2020-11-30
  Administered 2020-11-30: 333 mL via INTRAVENOUS

## 2020-11-30 MED ORDER — MEASLES, MUMPS & RUBELLA VAC IJ SOLR: 0.5000 mL | Freq: Once | INTRAMUSCULAR | Status: DC

## 2020-11-30 MED ORDER — IBUPROFEN 600 MG PO TABS: 600.0000 mg | ORAL_TABLET | Freq: Once | ORAL | Status: DC

## 2020-11-30 MED ORDER — OXYTOCIN-SODIUM CHLORIDE 30-0.9 UT/500ML-% IV SOLN
2.5000 [IU]/h | INTRAVENOUS | Status: DC
  Administered 2020-11-30: 2.5 [IU]/h via INTRAVENOUS

## 2020-11-30 NOTE — Discharge Summary (Addendum)
Postpartum Discharge Summary     Patient Name: Jill Huff DOB: 1990/12/17 MRN: [id number]  Date of admission: 11/30/2020 Delivery date:11/30/2020  Delivering provider: Darlina Rumpf  Date of discharge: 12/02/2020  Admitting diagnosis: Indication for care in labor or delivery [O75.9] Intrauterine pregnancy: [redacted]w[redacted]d    Secondary diagnosis:  Active Problems:   Pregnancy   Chronic hepatitis B without delta agent without hepatic coma ([place]   Vaginal bleeding during pregnancy   Normal labor   Language barrier   Indication for care in labor or delivery  Additional problems: none    Discharge diagnosis: Term Pregnancy Delivered                                              Post partum procedures:none Augmentation: AROM Complications: None  Hospital course: Onset of Labor With Vaginal Delivery      30y.o. yo G3P3003 at 441w0das admitted in Latent Labor on 11/30/2020 with vag bldg. Patient had an uncomplicated labor course, progressing spontaneously until she had AROM at 9-10cm.  Membrane Rupture Time/Date: 11:15 AM ,11/30/2020   Delivery Method:Vaginal, Spontaneous  Episiotomy: None  Lacerations:  None  Patient had an uncomplicated postpartum course.  She is ambulating, tolerating a regular diet, passing flatus, and urinating well. Patient is discharged home in stable condition on 12/02/20 with referral made to ID for Hep B follow up care.  Newborn Data: Birth date:11/30/2020  Birth time:11:36 AM  Gender:Female  Living status:Living  Apgars:9 ,9  Weight:3657 g (8lb 1oz)  Magnesium Sulfate received: No BMZ received: No Rhophylac:N/A MMR:N/A T-DaP:Given prenatally Flu: Yes Transfusion:No  Physical exam  Vitals:   12/01/20 0558 12/01/20 1418 12/01/20 2101 12/02/20 0558  BP: 118/73 126/82 113/78 110/69  Pulse: 75 72 75 70  Resp: _0 Temp: 98.9 F (37.2 C) 98 F (36.7 C) 98 F (36.7 C) 98 F (36.7 C)  TempSrc: Oral Oral Oral Oral  SpO2:  100% 100%    Weight:      Height:       General: alert and cooperative Lochia: appropriate Uterine Fundus: firm Incision: N/A DVT Evaluation: No evidence of DVT seen on physical exam. Labs: Lab Results  Component Value Date   WBC 5.9 11/30/2020   HGB 12.7 11/30/2020   HCT 38.8 11/30/2020   MCV 81.2 11/30/2020   PLT 133 (L) 11/30/2020   CMP Latest Ref Rng & Units 10/12/2020  Glucose 65 - 99 mg/dL 63(L)  BUN 7 - 25 mg/dL 4(L)  Creatinine 0.50 - 1.10 mg/dL 0.51  Sodium 135 - 146 mmol/L 138  Potassium 3.5 - 5.3 mmol/L 4.1  Chloride 98 - 110 mmol/L 107  CO2 20 - 32 mmol/L 25  Calcium 8.6 - 10.2 mg/dL 8.7  Total Protein 6.1 - 8.1 g/dL 6.1  Total Bilirubin 0.2 - 1.2 mg/dL 0.4  Alkaline Phos 39 - 117 IU/L -  AST 10 - 30 U/L 24  ALT 6 - 29 U/L 21   Edinburgh Score: Edinburgh Postnatal Depression Scale Screening Tool 12/01/2020  I have been able to laugh and see the funny side of things. 0  I have looked forward with enjoyment to things. 0  I have blamed myself unnecessarily when things went wrong. 0  I have been anxious or worried for no good reason. 0  I have felt scared  or panicky for no good reason. 0  Things have been getting on top of me. 0  I have been so unhappy that I have had difficulty sleeping. 0  I have felt sad or miserable. 0  I have been so unhappy that I have been crying. 0  The thought of harming myself has occurred to me. 0  Edinburgh Postnatal Depression Scale Total 0     After visit meds:  Allergies as of 12/02/2020   No Known Allergies     Medication List    STOP taking these medications   Doxylamine-Pyridoxine 10-10 MG Tbec   Magnesium 200 MG Tabs     TAKE these medications   ibuprofen 600 MG tablet Commonly known as: ADVIL Take 1 tablet (600 mg total) by mouth every 6 (six) hours.   WesTab Plus 27-1 MG Tabs Take 1 tablet by mouth daily.        Discharge home in stable condition Infant Feeding: Bottle and Breast Infant Disposition:home with  mother Discharge instruction: per After Visit Summary and Postpartum booklet. Activity: Advance as tolerated. Pelvic rest for 6 weeks.  Diet: routine diet Future Appointments: Future Appointments  Date Time Provider [place]  01/17/2021  2:30 PM Tommy Medal, Lavell Islam, MD [facility]   Follow up Visit:  Follow-up Information    Department, [facility]. Schedule an appointment as soon as possible for a visit in 4 week(s).   Why: for your postpartum appointment Contact information: [place] [address] [phone]                12/02/2020 Brooks Sailors, MD  CNM attestation I have seen and examined this patient and agree with above documentation in the resident's note.   Jill Huff is a 30 y.o. [id number] s/p vag del.   Pain is well controlled.  Plan for birth control is abstinence.  Method of Feeding: both  PE:  BP 110/69 (BP Location: Right Arm)    Pulse 70    Temp 98 F (36.7 C) (Oral)    Resp 18    Ht _0  (1.651 m)    Wt 83 kg    LMP 02/24/2020    SpO2 100%    Breastfeeding Unknown    BMI 30.45 kg/m  Fundus firm  Recent Labs    11/30/20 0629  HGB 12.7  HCT 38.8     Plan: discharge today - postpartum care discussed - f/u clinic in 4 weeks for postpartum visit- she will need to call the [facility] to schedule   Myrtis Ser, CNM 8:47 AM  12/02/2020

## 2020-11-30 NOTE — MAU Note (Signed)
Pt reports vaginal bleeding x 1 hour, denies pain. Reports good fetal movement.

## 2020-11-30 NOTE — Progress Notes (Signed)
Patient admitted with use of [employer] interpreter, [phone].

## 2020-11-30 NOTE — H&P (Addendum)
OBSTETRIC ADMISSION HISTORY AND PHYSICAL  Jill Huff is a 30 y.o. female G3P2002 with IUP at [redacted]w[redacted]d by LMP presenting for vaginal bleeding and found to be SOL. She reports +FMs, No LOF, no blurry vision, headaches or peripheral edema, and RUQ pain.  She plans on breast and bottle feeding. She is undecided on what birth control. 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    Past Surgical History: Past Surgical History:  Procedure Laterality Date   NO PAST SURGERIES      Obstetrical History: OB History     Gravida  3   Para  2   Term  2   Preterm  0   AB  0   Living  2      SAB  0   IAB  0   Ectopic  0   Multiple      Live Births              Social History Social History   Socioeconomic History   Marital status: Unknown    Spouse name: Not on file   Number of children: Not on file   Years of education: Not on file   Highest education level: Not on file  Occupational History   Not on file  Tobacco Use   Smoking status: Never Smoker   Smokeless tobacco: Never Used  Vaping Use   Vaping Use: Never used  Substance and Sexual Activity   Alcohol use: Yes    Comment: 1 bottle of wine per week   Drug use: No   Sexual activity: Yes  Other Topics Concern   Not on file  Social History Narrative   ** Merged History Encounter **       Social Determinants of Health   Financial Resource Strain: Not on file  Food Insecurity: Not on file  Transportation  Needs: Not on file  Physical Activity: Not on file  Stress: Not on file  Social Connections: Not on file    Family History: History reviewed. No pertinent family history.  Allergies: No Known Allergies  Medications Prior to Admission  Medication Sig Dispense Refill Last Dose   Prenatal Vit-Fe Fumarate-FA (WESTAB PLUS) 27-1 MG TABS Take 1 tablet by mouth daily.   11/29/2020 at Unknown time   Doxylamine-Pyridoxine 10-10 MG TBEC Take 2 tablets by mouth at bedtime. (Patient not taking: Reported on 11/16/2020) 60 tablet 0    Magnesium 200 MG TABS Take 2 tablets (400 mg total) by mouth at bedtime. (Patient not taking: Reported on 11/16/2020) 30 tablet 1      Review of Systems   All systems reviewed and negative except as stated in HPI  Blood pressure 120/75, pulse 75, temperature 98.4 F (36.9 C), temperature source Oral, resp. rate 14, height 5\' 5"  (1.651 m), weight 83 kg, last menstrual period 02/24/2020, SpO2 100 %. General appearance: alert, cooperative and no distress Lungs: normal respiratory effort Heart: regular rate and rhythm Abdomen: soft, non-tender; gravid Pelvic:  as noted below Extremities: Homans sign is negative, no sign of DVT Presentation: cephalic by MAU exam Fetal monitoringBaseline: 140 bpm, Variability: min-mod, Accelerations: Reactive and Decelerations: Absent Uterine activity difficult to trace Dilation: 5.5 Effacement (%): 80 Station: Ballotable Exam by:: V. Cresenzo   Prenatal labs: ABO, Rh: --/--/O POS (03/22 3614) Antibody: NEG (03/22 0629)neg Rubella: Immune (09/20 0700)imm RPR: NON REACTIVE (08/02 1113)  HBsAg:   positive HIV: Non Reactive (08/02 1113)  GBS: Negative/-- (02/28 0700) neg 1 hr Glucola failed, 3hr gtt passed Genetic screening : quad negative  Anatomy US normal  Prenatal Transfer Tool  Maternal Diabetes: No Genetic Screening: Normal Maternal Ultrasounds/Referrals: Normal Fetal Ultrasounds or other Referrals:  None Maternal  Substance Abuse:  No Significant Maternal Medications:  None Significant Maternal Lab Results: Group B Strep negative  Results for orders placed or performed during the hospital encounter of 11/30/20 (from the past 24 hour(s))  CBC   Collection Time: 11/30/20  6:29 AM  Result Value Ref Range   WBC 5.9 4.0 - 10.5 K/uL   RBC 4.78 3.87 - 5.11 MIL/uL   Hemoglobin 12.7 12.0 - 15.0 g/dL   HCT 43.1 54.0 - 08.6 %   MCV 81.2 80.0 - 100.0 fL   MCH 26.6 26.0 - 34.0 pg   MCHC 32.7 30.0 - 36.0 g/dL   RDW 76.1 95.0 - 93.2 %   Platelets 133 (L) 150 - 400 K/uL   nRBC 0.0 0.0 - 0.2 %  Type and screen [facility]   Collection Time: 11/30/20  6:29 AM  Result Value Ref Range   ABO/RH(D) O POS    Antibody Screen NEG    Sample Expiration      12/03/2020,2359 Performed at [facility], [address] [place], [place] [address]   Resp Panel by RT-PCR (Flu A&B, Covid) Nasopharyngeal Swab   Collection Time: 11/30/20  6:34 AM   Specimen: Nasopharyngeal Swab; Nasopharyngeal(NP) swabs in vial transport medium  Result Value Ref Range   SARS Coronavirus 2 by RT PCR NEGATIVE NEGATIVE   Influenza A by PCR NEGATIVE NEGATIVE   Influenza B by PCR NEGATIVE NEGATIVE    Patient Active Problem List   Diagnosis Date Noted   Vaginal bleeding during pregnancy 11/30/2020   Normal labor 11/30/2020   Language barrier 11/30/2020   Indication for care in labor or delivery 11/30/2020   Chronic hepatitis B without delta agent without hepatic coma (HCC) 10/12/2020   Abdominal pain affecting pregnancy 04/12/2020   Subchorionic hematoma in first trimester 04/12/2020   Threatened miscarriage 04/12/2020   Left wrist tendinopathy 04/06/2020   Ganglion cyst of dorsum of left wrist 04/06/2020   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 G3P2002 at [redacted]w[redacted]d here for  vaginal bleeding and SOL.  #Labor: Vaginal bleeding noted on speculum exam in MAU, 1 clot noted. Given intermittent minimal and moderate variability on FHT and GA as well as cervical change, decision made to admit patient. Continue expectant management. #Pain: PRN #FWB: Cat 1 #ID: GBS neg #MOF: both #MOC:Undecided  #Circ: unsure  #chronic hep b: will check viral load today. Patient had appointment with ID after delivery. #Language barrier: stratus interpreter used for entirety of visit.  Derrel Nip, MD  11/30/2020, 10:37 AM   OB FELLOW HISTORY AND PHYSICAL ATTESTATION  I have seen and examined this patient; I  agree with above documentation in the resident's note.   Marcy Siren, D.O. OB Fellow  11/30/2020, 4:57 PM

## 2020-11-30 NOTE — Lactation Note (Signed)
This note was copied from a baby's chart. Lactation Consultation Note  Patient Name: Jill Huff [id number] Date: 11/30/2020 Reason for consult: L&D Initial assessment;Other (Comment) (LC to see mom/ baby with in 40 mins , baby STS on moms chest. baby not showing sign of hunger, LC reviewed hand expressing with shadow. baby more awake, attempted latch , no latch, STS . [place] Interpreter Luisa Hart [phone]) Age: 28 mins.    Maternal Data Has patient been taught Hand Expression?: Yes Does the patient have breastfeeding experience prior to this delivery?: Yes How long did the patient breastfeed?: per mom 3 others 4-6 -8 months  Feeding Mother's Current Feeding Choice: Breast Milk  LATCH Score Latch: Too sleepy or reluctant, no latch achieved, no sucking elicited.  Audible Swallowing: None  Type of Nipple: Everted at rest and after stimulation  Comfort (Breast/Nipple): Soft / non-tender  Hold (Positioning): Full assist, staff holds infant at breast  LATCH Score: 4   Lactation Tools Discussed/Used    Interventions Interventions: Breast feeding basics reviewed;Assisted with latch;Skin to skin;Breast massage;Hand express;Adjust position  Discharge    Consult Status Consult Status: Follow-up Date: 11/30/20 Follow-up type: In-patient    Jill Huff 11/30/2020, 12:45 PM

## 2020-11-30 NOTE — MAU Provider Note (Signed)
Event Date/Time   First Provider Initiated Contact with Patient 11/30/20 0601     S: [name] is a 30 y.o. G3P2002 at [redacted]w[redacted]d  who presents to MAU today complaining of VB. Bleeding started this am. She saw it on her sheets and in the toilet, states was a small amt. She denies ctx but reports abdominal pain that comes and goes. Denies LOF. Reports +FM. Denies recent sex.  O: BP 110/82 (BP Location: Right Arm)    Pulse 91    Temp 98.2 F (36.8 C) (Oral)    Resp 16    LMP 02/24/2020    SpO2 100%  GENERAL: Well-developed, well-nourished female in no acute distress.  HEAD: Normocephalic, atraumatic.  CHEST: Normal effort of breathing, regular heart rate ABDOMEN: Soft, nontender, gravid SSE: 1 moderate sized clot present; visually dilated w/membranes seen SVE: 5.5/80/-1, vtx Fetal Monitoring: Baseline: 145 Variability: mod Accelerations: + Decelerations: no Contractions: irreg/UI  A: SIUP at [redacted]w[redacted]d  Labor Vaginal bleeding  P: Admit to LD Mngt per labor team  Donette Larry, CNM 11/30/2020 6:20 AM

## 2020-11-30 NOTE — Discharge Instructions (Signed)

## 2020-11-30 NOTE — Lactation Note (Signed)
This note was copied from a baby's chart. Lactation Consultation Note  Patient Name: Jill Huff [id number] Date: 11/30/2020 Reason for consult: Initial assessment;Mother's request;Term;Other (Comment) (Mother Chronic Hepatits B) Age:30 years  LC talked with Mom with assistance of translator Claudine [phone] Mom stated she started formula because she thought her milk did not come it. LC with help translator reassured Mom she can feed baby her breast milk. LC demonstrated infant latching in football and cross cradle. Infant did not sustain the latch since she was just fed formula. Mom was able to see infant transferring milk from her breasts and it was available.  Mom's nipples are everted with no signs or compression.   Plan 1. To feed by cues 8-12x in 24 hr period no more than 4 hrs without an attempt. Mom to offer both breasts and look for signs of milk transfer.  2. Mom to offer EBM or Formula with paced bottle feeding  3. Mom to pump using manual q 3hrs for 15 minutes.  4.  I and O sheet reviewed.   5. LC brochure of inpatient and outpatient services reviewed.  All questions answered at the end of the visit.   Maternal Data Has patient been taught Hand Expression?: Yes Does the patient have breastfeeding experience prior to this delivery?: Yes How long did the patient breastfeed?: first child 8 months, second child 4 months.  Feeding Mother's Current Feeding Choice: Breast Milk and Formula Nipple Type: Slow - flow  LATCH Score Latch: Repeated attempts needed to sustain latch, nipple held in mouth throughout feeding, stimulation needed to elicit sucking reflex.  Audible Swallowing: A few with stimulation  Type of Nipple: Everted at rest and after stimulation  Comfort (Breast/Nipple): Soft / non-tender  Hold (Positioning): Assistance needed to correctly position infant at breast and maintain latch.  LATCH Score: 7   Lactation Tools Discussed/Used Tools:  Pump;Flanges Flange Size: 24 Breast pump type: Manual Pump Education: Setup, frequency, and cleaning;Milk Storage Reason for Pumping: increase stmulation Pumping frequency: every 3 hrs for 15 minutes  Interventions Interventions: Breast feeding basics reviewed;Assisted with latch;Skin to skin;Adjust position;Support pillows;Position options;Expressed milk;Hand pump;Education  Discharge Pump: Manual WIC Program: Yes (Mom in communication with [facility])  Consult Status Consult Status: Follow-up Date: 12/01/20 Follow-up type: In-patient    Jill Blough  Huff 11/30/2020, 10:53 PM

## 2020-12-01 DIAGNOSIS — O9842 Viral hepatitis complicating childbirth: Secondary | ICD-10-CM | POA: Diagnosis not present

## 2020-12-01 DIAGNOSIS — B181 Chronic viral hepatitis B without delta-agent: Secondary | ICD-10-CM | POA: Diagnosis not present

## 2020-12-01 LAB — TYPE AND SCREEN
ABO/RH(D): O POS
Antibody Screen: NEGATIVE

## 2020-12-01 LAB — HEPATITIS B DNA, ULTRAQUANTITATIVE, PCR
HBV DNA SERPL PCR-ACNC: 458000 IU/mL
HBV DNA SERPL PCR-LOG IU: 5.661 log10 IU/mL

## 2020-12-01 NOTE — Progress Notes (Signed)
Post Partum Day 1 Subjective: Eating, drinking, voiding, ambulating well.  +flatus.  Lochia and pain wnl.  Denies dizziness, lightheadedness, or sob. No complaints.   Objective: Blood pressure 118/73, pulse 75, temperature 98.9 F (37.2 C), temperature source Oral, resp. rate 16, height 5\' 5"  (1.651 m), weight 83 kg, last menstrual period 02/24/2020, SpO2 100 %, unknown if currently breastfeeding.  Physical Exam:  General: alert, cooperative and no distress Lochia: appropriate Uterine Fundus: firm Incision: n/a DVT Evaluation: No evidence of DVT seen on physical exam. Negative Homan's sign. No cords or calf tenderness. No significant calf/ankle edema.  Recent Labs    11/30/20 0629  HGB 12.7  HCT 38.8    Assessment/Plan: Plan for discharge tomorrow, Breastfeeding and Contraception abstinence   Baby has to stay d/t feeding, so plan d/c tomorrow Declines discharge   LOS: 1 day   12/02/20 12/01/2020, 8:59 AM

## 2020-12-02 DIAGNOSIS — O9842 Viral hepatitis complicating childbirth: Secondary | ICD-10-CM | POA: Diagnosis not present

## 2020-12-02 DIAGNOSIS — B181 Chronic viral hepatitis B without delta-agent: Secondary | ICD-10-CM | POA: Diagnosis not present

## 2020-12-02 MED ORDER — IBUPROFEN 600 MG PO TABS: 600.0000 mg | ORAL_TABLET | Freq: Four times a day (QID) | ORAL | 0 refills | Status: DC

## 2020-12-02 NOTE — Progress Notes (Signed)
Swahili Education officer, community # [phone] obtained for discharge teaching and instructions.  Discharge paperwork given to mother. Jill Huff

## 2020-12-02 NOTE — Lactation Note (Signed)
This note was copied from a baby's chart. Lactation Consultation Note  Patient Name: Jill Huff [id number] Date: 12/02/2020 Reason for consult: Follow-up assessment;Term;Infant weight loss (4 % wt  loss, [place] interpreter - [place] - [phone] Safina. Per mom plans to continue to br  / form. LC reviewed and encouraged mom to breast feed both breast prior to giving formula to enhance her milk coming in quicker. see below for education.) Age:28 hours, P 3  Per mom  Concerned she won't have enough milk for the baby if she just breast feeds . LC reminded mom of the above conversation.  Per mom no longer is having sore nipples. Baby last fed per mom at 1:45 breast and formula. LC updated the doc flow sheets. Baby asleep and LC unable to check latch.     Maternal Data    Feeding Mother's Current Feeding Choice: Breast Milk and Formula  LATCH Score Lactation Tools Discussed/Used Tools: Pump;Flanges Flange Size: 24;27 Breast pump type: Manual Pump Education: Milk Storage  Interventions Interventions: Breast feeding basics reviewed;Hand pump  Discharge Pump: Manual  Consult Status Consult Status: Complete Date: 12/02/20    Kathrin Greathouse 12/02/2020, 3:11 PM

## 2020-12-03 ENCOUNTER — Telehealth: Payer: Self-pay | Admitting: *Deleted

## 2020-12-03 NOTE — Telephone Encounter (Signed)
Transition Care Management Unsuccessful Follow-up Telephone Call  Date of discharge and from where:  12/02/2020 - [facility]  Attempts:  1st Attempt  Reason for unsuccessful TCM follow-up call:  Left voice message  Called was made with a Swahili language interpreter

## 2020-12-06 NOTE — Telephone Encounter (Signed)
Transition Care Management Unsuccessful Follow-up Telephone Call  Date of discharge and from where:  12/02/2020 - [facility]  Attempts:  2nd Attempt  Reason for unsuccessful TCM follow-up call:  Left voice message

## 2020-12-07 DIAGNOSIS — B180 Chronic viral hepatitis B with delta-agent: Secondary | ICD-10-CM | POA: Diagnosis not present

## 2020-12-07 NOTE — Telephone Encounter (Signed)
Transition Care Management Unsuccessful Follow-up Telephone Call  Date of discharge and from where:  12/02/2020 - [facility]  Attempts:  3rd Attempt  Reason for unsuccessful TCM follow-up call:  Left voice message

## 2020-12-11 DIAGNOSIS — Z8619 Personal history of other infectious and parasitic diseases: Secondary | ICD-10-CM | POA: Insufficient documentation

## 2020-12-21 DIAGNOSIS — B181 Chronic viral hepatitis B without delta-agent: Secondary | ICD-10-CM | POA: Diagnosis not present

## 2021-01-07 DIAGNOSIS — B180 Chronic viral hepatitis B with delta-agent: Secondary | ICD-10-CM | POA: Diagnosis not present

## 2021-01-17 ENCOUNTER — Encounter: Payer: Self-pay | Admitting: Infectious Disease

## 2021-01-17 ENCOUNTER — Other Ambulatory Visit: Payer: Self-pay

## 2021-01-17 ENCOUNTER — Ambulatory Visit (INDEPENDENT_AMBULATORY_CARE_PROVIDER_SITE_OTHER): Payer: Medicaid Other | Admitting: Infectious Disease

## 2021-01-17 VITALS — BP 109/73 | HR 73 | Temp 97.5°F | Wt 177.0 lb

## 2021-01-17 DIAGNOSIS — O26899 Other specified pregnancy related conditions, unspecified trimester: Secondary | ICD-10-CM

## 2021-01-17 DIAGNOSIS — R109 Unspecified abdominal pain: Secondary | ICD-10-CM | POA: Diagnosis not present

## 2021-01-17 DIAGNOSIS — B181 Chronic viral hepatitis B without delta-agent: Secondary | ICD-10-CM | POA: Diagnosis not present

## 2021-01-17 NOTE — Progress Notes (Signed)
Subjective:  Chief complaint follow-up for chronic hepatitis B without delta agent and without hepatic coma   Patient ID: Jill Huff, female    DOB: 1991-01-08, 30 y.o.   MRN: [id number]  HPI  Jill Huff is a 30 year old lady who immigrated from [place], speaks Swahili only.  She was referred to [place] because she was found to have hepatitis B during her obstetric care.  Hepatitis B DNA was roughly 22,000 copies October.  LFTs were relatively normal.  Apparently and I learned this at the end of the visit she had been [facility] and they had done blood work as well.  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  Her obstetric team is ready to administer appropriate vaccines and preventative therapies to her baby when it is delivered.  When I saw her in clinic her labs and viral load did not meet criteria for treatment during pregnancy.  In the interim she has seen Annamarie Major, NP.  Postpartum on 29 March AST and ALT were 3453 and hep B DNA was 783,000.  Patient was started on Viread and has tolerated the medication well.  She is currently breast-feeding with her child is 1-1/2 months old.  While I do not mind seeing her at the same time she was seen do not Drisite I do not think this is effective for her to be seeing 2 providers for essentially duplicative therapy.       Past Medical History:  Diagnosis Date  . Auditory complaints of both ears 01/01/2018  . Caries 12/05/2018  . Chronic hepatitis B without delta agent without hepatic coma (HCC) 10/12/2020  . Hepatitis    Hep B positive  . Refugee health examination 07/24/2017  . 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 NO PAST SURGERIES      No family history on file.    Social History   Socioeconomic History  . Marital status: Unknown    Spouse name: Not on file  . Number of children: Not on file  .  Years of education: Not on file  . Highest education level: Not on file  Occupational History  . Not on file  Tobacco Use  . Smoking status: Never Smoker  . Smokeless tobacco: Never Used  Vaping Use  . Vaping Use: Never used  Substance and Sexual Activity  . Alcohol use: Yes    Comment: 1 bottle of wine per week  . Drug use: No  . Sexual activity: Yes  Other Topics Concern  . Not on file  Social History Narrative   ** Merged History Encounter **       Social Determinants of Health   Financial Resource Strain: Not on file  Food Insecurity: Not on file  Transportation Needs: Not on file  Physical Activity: Not on file  Stress: Not on file  Social Connections: Not on file    No Known Allergies   Current Outpatient Medications:  .  ibuprofen (ADVIL) 600 MG tablet, Take 1 tablet (600 mg total) by mouth every 6 (six) hours., Disp: 30 tablet, Rfl: 0 .  Prenatal Vit-Fe Fumarate-FA (WESTAB PLUS) 27-1 MG TABS, Take 1 tablet by mouth daily., Disp: , Rfl:  .  tenofovir (VIREAD) 300 MG tablet, Take 1 tablet by mouth daily., Disp: , Rfl:     Review of Systems  Constitutional: Negative for chills and fever.  HENT: Negative for congestion  and sore throat.   Eyes: Negative for photophobia.  Respiratory: Negative for cough, shortness of breath and wheezing.   Cardiovascular: Negative for chest pain, palpitations and leg swelling.  Gastrointestinal: Negative for abdominal pain, blood in stool, constipation, diarrhea, nausea and vomiting.  Genitourinary: Negative for dysuria, flank pain and hematuria.  Musculoskeletal: Negative for back pain and myalgias.  Skin: Negative for rash.  Neurological: Negative for dizziness, weakness and headaches.  Hematological: Does not bruise/bleed easily.  Psychiatric/Behavioral: Negative for agitation, behavioral problems, confusion, decreased concentration, dysphoric mood and suicidal ideas.       Objective:   Physical Exam Constitutional:       General: She is not in acute distress.    Appearance: She is well-developed. She is not diaphoretic.  HENT:     Head: Normocephalic and atraumatic.     Mouth/Throat:     Pharynx: No oropharyngeal exudate.  Eyes:     General: No scleral icterus.    Conjunctiva/sclera: Conjunctivae normal.  Cardiovascular:     Rate and Rhythm: Normal rate and regular rhythm.  Pulmonary:     Effort: Pulmonary effort is normal. No respiratory distress.     Breath sounds: No wheezing.  Musculoskeletal:        General: No tenderness.     Cervical back: Normal range of motion and neck supple.  Skin:    General: Skin is warm and dry.     Coloration: Skin is not pale.     Findings: No erythema or rash.  Neurological:     General: No focal deficit present.     Mental Status: She is alert and oriented to person, place, and time.     Motor: No abnormal muscle tone.     Coordination: Coordination normal.  Psychiatric:        Mood and Affect: Mood normal.        Behavior: Behavior normal.        Thought Content: Thought content normal.        Judgment: Judgment normal.     Gravid      Assessment & Plan:   Chronic hepatitis B without hepatic coma: We will check a CMP today and hepatitis B DNA In future she can just followup with Annamarie Major NP with whom she originally established care.   Her husband should certainly be tested for hepatitis B and HIV as well.  I spent greater than 30 minutes with the patient including greater than 50% of time in face to face counsel of the patient re the nature of HBV infection and its treatment via the translator and in coordination of her care.

## 2021-01-20 LAB — COMPLETE METABOLIC PANEL WITH GFR
AG Ratio: 1.4 (calc) (ref 1.0–2.5)
ALT: 26 U/L (ref 6–29)
AST: 25 U/L (ref 10–30)
Albumin: 4.3 g/dL (ref 3.6–5.1)
Alkaline phosphatase (APISO): 80 U/L (ref 31–125)
BUN: 10 mg/dL (ref 7–25)
CO2: 28 mmol/L (ref 20–32)
Calcium: 9.5 mg/dL (ref 8.6–10.2)
Chloride: 104 mmol/L (ref 98–110)
Creat: 0.64 mg/dL (ref 0.50–1.10)
GFR, Est African American: 141 mL/min/{1.73_m2} (ref >=60)
GFR, Est Non African American: 121 mL/min/{1.73_m2} (ref >=60)
Globulin: 3.1 g/dL (calc) (ref 1.9–3.7)
Glucose, Bld: 86 mg/dL (ref 65–99)
Potassium: 4 mmol/L (ref 3.5–5.3)
Sodium: 139 mmol/L (ref 135–146)
Total Bilirubin: 0.4 mg/dL (ref 0.2–1.2)
Total Protein: 7.4 g/dL (ref 6.1–8.1)

## 2021-01-20 LAB — HEPATITIS B DNA, ULTRAQUANTITATIVE, PCR
Hepatitis B DNA (Calc): 2.97 Log IU/mL — ABNORMAL HIGH
Hepatitis B DNA: 930 IU/mL — ABNORMAL HIGH

## 2021-02-24 DIAGNOSIS — Q740 Other congenital malformations of upper limb(s), including shoulder girdle: Secondary | ICD-10-CM | POA: Diagnosis not present

## 2021-03-22 DIAGNOSIS — B180 Chronic viral hepatitis B with delta-agent: Secondary | ICD-10-CM | POA: Diagnosis not present

## 2021-03-22 DIAGNOSIS — K5909 Other constipation: Secondary | ICD-10-CM | POA: Diagnosis not present

## 2021-03-29 DIAGNOSIS — B180 Chronic viral hepatitis B with delta-agent: Secondary | ICD-10-CM | POA: Diagnosis not present

## 2021-04-13 ENCOUNTER — Encounter: Payer: Self-pay | Admitting: *Deleted

## 2021-07-19 ENCOUNTER — Ambulatory Visit (HOSPITAL_COMMUNITY)
Admission: EM | Admit: 2021-07-19 | Discharge: 2021-07-19 | Disposition: A | Discharge status: Home / Self Care | Payer: Medicaid Other | Attending: Emergency Medicine | Admitting: Emergency Medicine

## 2021-07-19 ENCOUNTER — Other Ambulatory Visit: Payer: Self-pay

## 2021-07-19 ENCOUNTER — Encounter (HOSPITAL_COMMUNITY): Payer: Self-pay | Admitting: Emergency Medicine

## 2021-07-19 DIAGNOSIS — R051 Acute cough: Secondary | ICD-10-CM

## 2021-07-19 MED ORDER — PREDNISONE 10 MG (21) PO TBPK: ORAL_TABLET | Freq: Every day | ORAL | 0 refills | Status: DC

## 2021-07-19 NOTE — ED Triage Notes (Signed)
Pt is present today with a cough. Pt sx started last Saturday

## 2021-07-19 NOTE — Discharge Instructions (Addendum)
In addition to the prescription prednisone, you may use a non-prescription cough medication called Robitussin DAC; take 2 teaspoons every 8 hours. Or you can use Delsym cough medicine: take 2 teaspoons every 12 hours.  Cough has four common causes:   Allergies Viral Infections Acid Reflux Bacterial Infection Allergies, viruses and acid reflux are treated by controlling symptoms or eliminating the cause.

## 2021-07-19 NOTE — ED Provider Notes (Signed)
[place]    CSN: [id number] Arrival date & time: 07/19/21  [id number]      History   Chief Complaint Chief Complaint  Patient presents with   Cough    Chest congestion    HPI Jill Huff is a 30 y.o. female.  Patient reports 4 days of coughing.  Has never had a cough like this before.  Does not have any other symptoms.  No fever, no nasal congestion.  Does not feel ill.  Denies history of acid reflux or dyspepsia or reflux symptoms.  For 2 days, the cough was occasionally productive for white sputum.  For the last 2 days, the cough has been dry.  Patient and husband report that she coughs all night long.  She coughs occasionally during the day, but is mostly at night.  Cough is not associated with chest pain, but patient reports she occasionally has pain in her central chest area over her sternum that is tender when she pushes on it.  She does not have this pain at this time.  A Swahili video interpreter helped with this visit.   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    Patient Active Problem List   Diagnosis Date Noted   Vaginal bleeding during pregnancy 11/30/2020   Normal labor 11/30/2020   Language barrier 11/30/2020   Indication for care in labor or delivery 11/30/2020   Chronic hepatitis B without delta agent without hepatic coma ([place]) 10/12/2020   Abdominal pain affecting pregnancy 04/12/2020   Subchorionic hematoma in first trimester 04/12/2020   Threatened miscarriage 04/12/2020   Left wrist tendinopathy 04/06/2020   Ganglion cyst of dorsum of left wrist 04/06/2020    Degenerative joint disease of left wrist 04/06/2020   Pregnancy 07/24/2017   Skeletal dysplasia of radius, 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 Authorizing Provider  predniSONE (STERAPRED UNI-PAK 21 TAB) 10 MG (21) TBPK tablet Take by mouth daily. Take 6 tabs by mouth daily  for 2 days, then 5 tabs for 2 days, then 4 tabs for 2 days, then 3 tabs for 2 days, 2 tabs for 2 days, then 1 tab by mouth daily for 2 days 07/19/21  Yes Jemia Fata, Dionne Bucy, NP  ibuprofen (ADVIL) 600 MG tablet Take 1 tablet (600 mg total) by mouth every 6 (six) hours. 12/02/20   Muus, Gwynn Burly, MD  Prenatal Vit-Fe Fumarate-FA (WESTAB PLUS) 27-1 MG TABS Take 1 tablet by mouth daily. 10/29/20  [provider]  tenofovir (VIREAD) 300 MG tablet Take 1 tablet by mouth daily. 12/21/20 12/21/21  [provider]  Cetirizine HCl 10 MG CAPS Take 1 capsule (10 mg total) by mouth daily. Patient not taking: Reported on 04/01/2020 01/13/19 04/08/20  Ok Edwards, PA-C  fluticasone [facility]) 50 MCG/ACT nasal spray Place 2 sprays into both nostrils daily. Patient not taking: Reported on 04/01/2020 01/13/19 04/08/20  Arturo Morton    Family History History reviewed. No pertinent family history.  Social History Social History   Tobacco Use   Smoking status: Never   Smokeless tobacco: Never  Vaping Use   Vaping Use: Never used  Substance Use Topics   Alcohol use: Yes    Comment: 1 bottle of wine per week   Drug use: No     Allergies   Patient has no known allergies.   Review of Systems Review of Systems  Constitutional:  Negative for appetite change, chills and fever.  HENT:   Negative for congestion, postnasal drip and sore throat.   Respiratory:  Positive for cough. Negative for shortness of breath and wheezing.   Cardiovascular:  Negative for chest pain.    Physical Exam Triage Vital Signs ED Triage Vitals  Enc Vitals Group     BP 07/19/21 0933 111/71     Pulse Rate 07/19/21 0933 70     Resp 07/19/21 0933 18     Temp 07/19/21 0933 (!) 97.4 F (36.3 C)     Temp src --      SpO2 07/19/21 0933 97 %     Weight --      Height --      Head Circumference --      Peak Flow --      Pain Score 07/19/21 1022 0     Pain Loc --      Pain Edu? --      Excl. in [place]? --    No data found.  Updated Vital Signs BP 111/71   Pulse 70   Temp (!) 97.4 F (36.3 C)   Resp 18   SpO2 97%   Breastfeeding Yes   Visual Acuity Right Eye Distance:   Left Eye Distance:   Bilateral Distance:    Right Eye Near:   Left Eye Near:    Bilateral Near:     Physical Exam Constitutional:      General: She is not in acute distress.    Appearance: Normal appearance. She is not ill-appearing.  HENT:     Right Ear: Tympanic membrane, ear canal and external ear normal.     Left Ear: Tympanic membrane, ear canal and external ear normal.     Nose: Nose normal.     Mouth/Throat:     Mouth: Mucous membranes are moist.     Pharynx: Oropharynx is clear.  Cardiovascular:     Rate and Rhythm: Normal rate and regular rhythm.  Pulmonary:     Effort: Pulmonary effort is normal.     Breath sounds: Normal breath sounds.  Neurological:     Mental Status: She is alert.     UC Treatments / Results  Labs (all labs ordered are listed, but only abnormal results are displayed) Labs Reviewed - No data to display  EKG   Radiology No results found.  Procedures Procedures (including critical care time)  Medications Ordered in UC Medications - No data to display  Initial Impression / Assessment and Plan / UC Course  I have reviewed the triage vital signs and the nursing  notes.  Pertinent labs & imaging results that were available during my care of the patient were reviewed by me and considered in my medical decision making (see chart for details).    Patient's cough could be due to many causes.  As it is acute and she is only had it for 4 days, I will prescribe prednisone in case it is related to inflammation, and recommended cough syrup.  Final Clinical Impressions(s) / UC Diagnoses   Final diagnoses:  Acute cough     Discharge Instructions      In addition to the prescription prednisone, you may use a non-prescription cough medication called Robitussin DAC; take 2 teaspoons every 8 hours. Or you can use Delsym cough medicine: take 2 teaspoons every 12 hours.  Cough has four common causes:   Allergies Viral Infections Acid Reflux Bacterial Infection Allergies, viruses and acid reflux are treated by controlling symptoms or eliminating the cause.    ED Prescriptions     Medication Sig Dispense Auth. Provider   predniSONE (STERAPRED UNI-PAK 21 TAB) 10 MG (21) TBPK tablet Take by mouth daily. Take 6 tabs by mouth daily  for 2 days, then 5 tabs for 2 days, then 4 tabs for 2 days, then 3 tabs for 2 days, 2 tabs for 2 days, then 1 tab by mouth daily for 2 days 42 tablet Averiana Clouatre, Marzella Schlein, NP      PDMP not reviewed this encounter.   Cathlyn Parsons, NP 07/19/21 1124

## 2021-08-17 DIAGNOSIS — B180 Chronic viral hepatitis B with delta-agent: Secondary | ICD-10-CM | POA: Diagnosis not present

## 2021-08-17 DIAGNOSIS — Z8619 Personal history of other infectious and parasitic diseases: Secondary | ICD-10-CM | POA: Diagnosis not present

## 2021-09-01 DIAGNOSIS — B180 Chronic viral hepatitis B with delta-agent: Secondary | ICD-10-CM | POA: Diagnosis not present

## 2021-09-11 NOTE — L&D Delivery Note (Addendum)
OB/GYN Faculty Practice Delivery Note  Jill Huff is a 31 y.o. [id number] s/p SVD at [redacted]w[redacted]d. She was admitted for SOL.   ROM: 1h 74m with clear fluid GBS Status: neg   Maximum Maternal Temperature:  Temp (24hrs), Avg:98.1 F (36.7 C), Min:97.6 F (36.4 C), Max:98.4 F (36.9 C)    Labor Progress: Patient arrived at 7 cm dilation and was induced with AROM.   Delivery Date/Time: 08/30/2022 at 1152 Delivery: Called to room and patient was complete and pushing. Head delivered in LOA position. No nuchal cord present.  30 seconds shoulder dystocia requiring McRoberts and modified wood screw with rotation of the posterior arm and then delivery of the anterior shoulder.  Infant with spontaneous cry, placed on mother's abdomen, dried and stimulated. Cord clamped x 2 after 1-minute delay, and cut by provider. Cord blood drawn. Placenta delivered spontaneously with gentle cord traction. Fundus firm with massage and Pitocin. Labia, perineum, vagina, and cervix inspected with no lacerations .   Placenta: Spontaneous, intact, three-vessel cord Complications: 30 seconds shoulder dystocia resolved with McRoberts and modified woodscrew  Lacerations: None EBL: 93 mL Analgesia: None  Infant: APGAR (1 MIN): 8  APGAR (5 MINS): 9 APGAR (10 MINS):    Weight: Pending  Derrel Nip, MD  OB fellow 08/30/2022 12:09 PM

## 2022-01-05 ENCOUNTER — Ambulatory Visit (INDEPENDENT_AMBULATORY_CARE_PROVIDER_SITE_OTHER): Payer: Medicaid Other | Admitting: Family Medicine

## 2022-01-05 ENCOUNTER — Encounter: Payer: Self-pay | Admitting: Family Medicine

## 2022-01-05 VITALS — BP 110/65 | HR 87 | Ht 65.0 in | Wt 174.4 lb

## 2022-01-05 DIAGNOSIS — R0789 Other chest pain: Secondary | ICD-10-CM

## 2022-01-05 DIAGNOSIS — Z124 Encounter for screening for malignant neoplasm of cervix: Secondary | ICD-10-CM | POA: Diagnosis not present

## 2022-01-05 MED ORDER — MELOXICAM 15 MG PO TABS: 15.0000 mg | ORAL_TABLET | Freq: Every day | ORAL | 0 refills | Status: DC | PRN

## 2022-01-05 NOTE — Progress Notes (Deleted)
Jill Huff is {Pc accompanied by:5710} ?Sources of clinical information for visit is/are {Information source:60032}. ?Nursing assessment for this office visit was reviewed with the patient for accuracy and revision.  ? 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 ?Patient stopped breast feeding two months ago. ? ?Previous Report(s) Reviewed: {Outside review:15817}  ? ?  11/16/2020  ?  9:58 AM  ?Depression screen PHQ 2/9  ?Decreased Interest 0  ?Down, Depressed, Hopeless 0  ?PHQ - 2 Score 0  ? ?[place] Office Visit from 04/01/2020 in [place]  ?Thoughts that you would be better off dead, or of hurting yourself in some way Not at all  ?PHQ-9 Total Score 0  ? ?  ?  ? ?  11/16/2020  ?  9:57 AM 10/12/2020  ?  8:51 AM 06/30/2019  ?  9:06 AM 12/03/2018  ?  8:47 AM 01/01/2018  ? 11:00 AM  ?Fall Risk   ?Falls in the past year? 0 0 0 0 No  ?Number falls in past yr:   0 0   ?Injury with Fall?    0   ?Risk for fall due to : No Fall Risks      ?Follow up Falls evaluation completed  Falls evaluation completed Falls evaluation completed   ? ? ? ?  11/16/2020  ?  9:58 AM 10/12/2020  ?  8:52 AM 04/01/2020  ? 10:55 AM  ?PHQ9 SCORE ONLY  ?PHQ-9 Total Score 0 0 0  ? ? ?Adult vaccines due  ?Topic Date Due  ? TETANUS/TDAP  09/16/2030  ? ? ?Health Maintenance Due  ?Topic Date Due  ? Hepatitis C Screening  Never done  ? PAP-Cervical Cytology Screening  Never done  ? PAP SMEAR-Modifier  Never done  ?  ? ? ?History/P.E. limitations: {exam; limitations ed:60112} ? ?Adult vaccines due  ?Topic Date Due  ? TETANUS/TDAP  09/16/2030  ? There are no preventive care reminders to display for this patient.  ?Health Maintenance Due  ?Topic Date Due  ? Hepatitis C Screening  Never done  ? PAP-Cervical Cytology Screening  Never done  ? PAP SMEAR-Modifier  Never done  ?  ? ?No chief complaint on file. ?  ?Onset: *** ?Location: *** ?Quality: *** ?Severity: *** ?Function: *** ?Duration: *** ?Pattern: *** ?Course: *** ?Radiation:  *** ?Relief: *** ?Precipitant: *** ?Associated Symptoms: ?      Restricted ROM/stiffness/swelling:  *** ?      Muscle ache/cramp/spasms: *** ?      Muscle strength change: *** ?      Change in sensation (dysesthesia/itch or numbness): *** ?Functional Impact: ?      Change in social or recreation: *** ?      ADLs limitations: *** ?     ?      Change in work activities: *** ?              Last time worked: *** ?      Change in sleep from pain: *** ?      ?Trauma (Acute or Chronic): *** ?Prior Diagnostic Testing or Treatments: *** ?Relevant PMH/PSH: *** ? ?

## 2022-01-05 NOTE — Progress Notes (Addendum)
Jill Huff is alone ?Sources of clinical information for visit is/are patient, past medical records, and [place] [facility]. ?Nursing assessment for this office visit was reviewed with the patient for accuracy and revision.  ? ?Interview conducted using Swahili interpreter from [employer] interpre[employer] ? ?  11/16/2020  ?  9:58 AM  ?Depression screen PHQ 2/9  ?Decreased Interest 0  ?Down, Depressed, Hopeless 0  ?PHQ - 2 Score 0  ? ?[place] Office Visit from 04/01/2020 in [place]  ?Thoughts that you would be better off dead, or of hurting yourself in some way Not at all  ?PHQ-9 Total Score 0  ? ?  ?  ? ?  11/16/2020  ?  9:57 AM 10/12/2020  ?  8:51 AM 06/30/2019  ?  9:06 AM 12/03/2018  ?  8:47 AM 01/01/2018  ? 11:00 AM  ?Fall Risk   ?Falls in the past year? 0 0 0 0 No  ?Number falls in past yr:   0 0   ?Injury with Fall?    0   ?Risk for fall due to : No Fall Risks      ?Follow up Falls evaluation completed  Falls evaluation completed Falls evaluation completed   ? ? ? ?  11/16/2020  ?  9:58 AM 10/12/2020  ?  8:52 AM 04/01/2020  ? 10:55 AM  ?PHQ9 SCORE ONLY  ?PHQ-9 Total Score 0 0 0  ? ? ?Adult vaccines due  ?Topic Date Due  ? TETANUS/TDAP  09/16/2030  ? ? ?Health Maintenance Due  ?Topic Date Due  ? Hepatitis C Screening  Never done  ? PAP-Cervical Cytology Screening  Never done  ? PAP SMEAR-Modifier  Never done  ?  ? ? ?History/P.E. limitations: communication barrier Language ? ?Adult vaccines due  ?Topic Date Due  ? TETANUS/TDAP  09/16/2030  ? There are no preventive care reminders to display for this patient.  ?Health Maintenance Due  ?Topic Date Due  ? Hepatitis C Screening  Never done  ? PAP-Cervical Cytology Screening  Never done  ? PAP SMEAR-Modifier  Never done  ?  ? ?Chief Complaint  ?Patient presents with  ? Chest Pain  ?  ?Onset: 7 months ago ?Location: middle of chest ?Quality: sharp ?Severity: mild to moderate ?Function: no impairment of abilities.  She is a Agricultural engineer.  She  has not worked at [employer] for over a year.  ?Pattern: mostly in the morning when she is first getting up from bed.  The discomfort lasts for about an hour.  It occurs less than once a week.  ?Course: no changes ?Radiation: no radiation into arms/neck/back/shoulders ?Relief: Ice ?Precipitant: The pain can return when lifting with her arms, e.g. her 20 yr old child, and with pulling her shoulders back (shoulder bones coming towards each other on back) ?Associated Symptoms: ?      Restricted ROM/stiffness/swelling:  no ?      Muscle ache/cramp/spasms: no ?      Change in sensation (dysesthesia/itch or numbness): no ?      No SHOB/DOE, no leg swelling, no leg pain.  ?Functional Impact: ?      Change in social or recreation: no ?      ADLs limitations: no ?      Change in mood/energy level: no ?      Change in physical abilities: no ? 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  ?Relevant PMH/PSH: No history of acute chest trauma. No history of CAD ? ?Health Maintenance: No Pap  ? ?Physical Exam ?VS: reviewed ?GEN: alert, cooperative, and mild distress ?HEENT: Neck / Thyroid: Supple, no masses, nodes, nodules or enlargement. ?COR: Heart sounds are normal.  Regular rate and rhythm without murmur, gallop or rub. ?LUNG: clear to auscultation bilaterally ?Ext: no leg edema ?MSK: TTP sternal angle (reproduces complaint), No ttp manubrium or sternum bodies, nor parasternal regions.  (+) Reproduction of complaint with crowing rooster and cross-chest manauevers ? ?EKG: NSA, normal EKG.  No prior EKG for comparisions. ?

## 2022-01-05 NOTE — Patient Instructions (Addendum)
Dk Ellanor Feuerstein anafikiri maumivu yako yanatoka kwenye mifupa na misuli kwenye kifua chako. ? ? Maumivu ya [place] wa kifua ni maumivu ndani au karibu na mifupa na misuli ya kifua chako. ? ? [place]za kut[place] kwa barafu, mapumziko, na dawa. Hali yako inaweza pia kuwa bora ikiwa utaepuka kufanya mambo ambayo husababisha maumivu. ? ?[place] meloxicam ya dawa Milbert Coulter kwa siku ikiwa una maumivu katika kifua chako. ? ?Johnathan Hausen ya [place] tatu kwa siku kwa Gaye Alken. ? ?

## 2022-01-06 DIAGNOSIS — Z124 Encounter for screening for malignant neoplasm of cervix: Secondary | ICD-10-CM | POA: Insufficient documentation

## 2022-01-06 DIAGNOSIS — R0789 Other chest pain: Secondary | ICD-10-CM | POA: Insufficient documentation

## 2022-01-06 NOTE — Assessment & Plan Note (Signed)
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  Clinical Predication Rule for Chest Wall Syndrome score 6 with very high specificity for chest wall syndrome.  ?Doubt ischemic heart diseae given atypical presentation, low pretest probability significant CAD; doubt PE given low pretest prob, central thoracic location of pain, and lack of leg sxs or signs.   ? ?Recommendations ?- Rx meloxicam on days with pain  ?- Stretching exercise that move sternal angle and retract ribs from anterior center.  Demonstrated exercises to patient.  ?- RTC: Red Flags of worsening pain to seek immediate care and RTC 4 weeks to assess complaints response to recommendations.  ?

## 2022-01-06 NOTE — Assessment & Plan Note (Signed)
Ms Aybar's cervical cancer screening is due.  ?She requests a female provider perform the screening.  ?A referral to Gyn for cervical cancer screening made.  ?

## 2022-02-02 ENCOUNTER — Ambulatory Visit (INDEPENDENT_AMBULATORY_CARE_PROVIDER_SITE_OTHER): Payer: Medicaid Other | Admitting: Family Medicine

## 2022-02-02 ENCOUNTER — Encounter: Payer: Self-pay | Admitting: Family Medicine

## 2022-02-02 DIAGNOSIS — R0789 Other chest pain: Secondary | ICD-10-CM

## 2022-02-02 DIAGNOSIS — Z124 Encounter for screening for malignant neoplasm of cervix: Secondary | ICD-10-CM | POA: Diagnosis not present

## 2022-02-02 DIAGNOSIS — B180 Chronic viral hepatitis B with delta-agent: Secondary | ICD-10-CM

## 2022-02-03 ENCOUNTER — Encounter: Payer: Self-pay | Admitting: Family Medicine

## 2022-02-03 NOTE — Assessment & Plan Note (Signed)
Jill Huff never received an app't for GYN for her Pap An appointment was made to a female physican to perform a pap while patient was in office today.

## 2022-02-03 NOTE — Progress Notes (Signed)
Jill Huff is alone Sources of clinical information for visit is/are patient. Nursing assessment for this office visit was reviewed with the patient for accuracy and revision.     Previous Report(s) Reviewed: none     11/16/2020    9:58 AM  Depression screen PHQ 2/9  Decreased Interest 0  Down, Depressed, Hopeless 0  PHQ - 2 Score 0   Flowsheet Row Office Visit from 04/01/2020 in [place] [facility]  Thoughts that you would be better off dead, or of hurting yourself in some way Not at all  PHQ-9 Total Score 0          11/16/2020    9:57 AM 10/12/2020    8:51 AM 06/30/2019    9:06 AM 12/03/2018    8:47 AM 01/01/2018   11:00 AM  Fall Risk   Falls in the past year? 0 0 0 0 No  Number falls in past yr:   0 0   Injury with Fall?    0   Risk for fall due to : No Fall Risks      Follow up Falls evaluation completed  Falls evaluation completed Falls evaluation completed        11/16/2020    9:58 AM 10/12/2020    8:52 AM 04/01/2020   10:55 AM  PHQ9 SCORE ONLY  PHQ-9 Total Score 0 0 0    Adult vaccines due  Topic Date Due   TETANUS/TDAP  09/16/2030    Health Maintenance Due  Topic Date Due   Hepatitis C Screening  Never done   PAP-Cervical Cytology Screening  Never done   PAP SMEAR-Modifier  Never done      History/P.E. limitations: communication barrier Language Swahili interpreter Onalee Hua [phone]  Adult vaccines due  Topic Date Due   TETANUS/TDAP  09/16/2030   There are no preventive care reminders to display for this patient.  Health Maintenance Due  Topic Date Due   Hepatitis C Screening  Never done   PAP-Cervical Cytology Screening  Never done   PAP SMEAR-Modifier  Never done     Chief Complaint  Patient presents with   Follow-up    Chest pain

## 2022-02-03 NOTE — Assessment & Plan Note (Signed)
Established problem that has improved and has meet goal of no further chest pain.  Jill Huff has performed the stretching exercises recommended and has used the meloxicam occasionally. She has had no chest pain in a couple weeks.  RTC prn return of pain

## 2022-03-02 ENCOUNTER — Other Ambulatory Visit (HOSPITAL_COMMUNITY)
Admission: RE | Admit: 2022-03-02 | Discharge: 2022-03-02 | Disposition: A | Discharge status: Home / Self Care | Payer: Medicaid Other | Source: Ambulatory Visit | Attending: Family Medicine | Admitting: Family Medicine

## 2022-03-02 ENCOUNTER — Ambulatory Visit (INDEPENDENT_AMBULATORY_CARE_PROVIDER_SITE_OTHER): Payer: Medicaid Other | Admitting: Family Medicine

## 2022-03-02 VITALS — BP 114/71 | HR 80 | Ht 65.0 in | Wt 173.6 lb

## 2022-03-02 DIAGNOSIS — Z124 Encounter for screening for malignant neoplasm of cervix: Secondary | ICD-10-CM

## 2022-03-02 DIAGNOSIS — Z0000 Encounter for general adult medical examination without abnormal findings: Secondary | ICD-10-CM | POA: Insufficient documentation

## 2022-03-02 DIAGNOSIS — B180 Chronic viral hepatitis B with delta-agent: Secondary | ICD-10-CM

## 2022-03-02 DIAGNOSIS — B379 Candidiasis, unspecified: Secondary | ICD-10-CM | POA: Diagnosis not present

## 2022-03-02 DIAGNOSIS — Z3490 Encounter for supervision of normal pregnancy, unspecified, unspecified trimester: Secondary | ICD-10-CM | POA: Diagnosis not present

## 2022-03-02 DIAGNOSIS — N898 Other specified noninflammatory disorders of vagina: Secondary | ICD-10-CM

## 2022-03-02 LAB — POCT WET PREP (WET MOUNT)
Clue Cells Wet Prep Whiff POC: NEGATIVE
Trichomonas Wet Prep HPF POC: ABSENT

## 2022-03-02 LAB — POCT URINE PREGNANCY: Preg Test, Ur: POSITIVE — AB

## 2022-03-02 MED ORDER — FLUCONAZOLE 150 MG PO TABS: 150.0000 mg | ORAL_TABLET | Freq: Once | ORAL | 0 refills | Status: DC

## 2022-03-02 MED ORDER — WESTAB PLUS 27-1 MG PO TABS: 1.0000 | ORAL_TABLET | Freq: Every day | ORAL | 3 refills | Status: AC

## 2022-03-02 MED ORDER — CLOTRIMAZOLE 1 % EX CREA: 1.0000 | TOPICAL_CREAM | Freq: Two times a day (BID) | CUTANEOUS | 0 refills | Status: DC

## 2022-03-02 NOTE — Progress Notes (Signed)
    SUBJECTIVE:   CHIEF COMPLAINT / HPI:   Interpreter: Jocelyn  Pap Smear:  31 yo [id number] p/f pap smear today in clinic.  - Sexually active with 1 female partner(s) - Contraception: none - Has not had a period since March 2023 Symptoms - Abnormal vaginal discharge: None  - Missed period: yes; see above - Fever: none - Abdominal/Pelvic pain: none - Vaginal bleeding: none - Pain during sex: yes, burning in vagina with sex and during urination, has been present for a couple of months  - Rash: None    PERTINENT  PMH / PSH:  Patient Active Problem List   Diagnosis Date Noted   Healthcare maintenance 03/02/2022   Yeast infection 03/02/2022   Pregnancy 03/02/2022   Chest wall pain 01/06/2022   Cervical cancer screening 01/06/2022   History of hepatitis D virus infection 12/11/2020   Language barrier 11/30/2020   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 Delma Freeze as chaperone. White discharge around vaginal vault and near cervix, normal cervix with no evidence of nodules or lesions. No vaginal lesions. Pelvic exam performed without palpation of adnexal masses.    ASSESSMENT/PLAN:   Cervical cancer screening Reviewed labs and allergies, pap performed today and will call patient with results.  Patient's questions answered to their satisfaction.   Pregnancy Upreg positive.  Unsure of exact date as patient has not had a regular cycle. Will continue with a dating U/S, ordering today. Additionally, will order prenatal labs, performed pap smear and pelvic exam as well. Ordering Clotrimazole for patient to treat yeast infection as she is likely in 2nd trimester. Will schedule patient with Lum Babe for first prenatal visit.   Yeast infection Clotrimazole sent in for treatment.   Chronic viral hepatitis B with hepatitis delta (HCC) Tenofivir, safe in pregnancy; continuing with f/u with [place] NP in 03/2022    04/2022, MD [facility]

## 2022-03-02 NOTE — Assessment & Plan Note (Signed)
Reviewed labs and allergies, pap performed today and will call patient with results.  Patient's questions answered to their satisfaction.

## 2022-03-02 NOTE — Assessment & Plan Note (Signed)
Clotrimazole sent in for treatment.

## 2022-03-02 NOTE — Assessment & Plan Note (Addendum)
Upreg positive. Unsure of exact date as patient has not had a regular cycle. Will continue with a dating U/S, ordering today. Additionally, will order prenatal labs, performed pap smear and pelvic exam as well. Ordering Clotrimazole for patient to treat yeast infection as she is likely in 2nd trimester. Will schedule patient with [place] for first prenatal visit.

## 2022-03-02 NOTE — Patient Instructions (Addendum)
It was great to see you today! Thank you for choosing [facility] for your primary care. Jill Huff was seen for pap smear today.  Today we addressed: Continuing with a pap smear, I will call or message you with the results.  We will continue with all prenatal labs today and we are going to schedule an ultrasound to see how far along you are 2.   Please follow up for your appt with the liver clinic in July   Orders Placed This Encounter  Procedures   US OB LESS THAN 14 WEEKS W/ OB TRANSVAGINAL AND DOPPLER    Standing Status:   Future    Standing Expiration Date:   03/03/2023    Order Specific Question:   Reason for Exam (SYMPTOM  OR DIAGNOSIS REQUIRED)    Answer:   Dating U/S    Order Specific Question:   Preferred Imaging Location?    Answer:   [facility] Ultrasound   CBC/D/Plt+RPR+Rh+ABO+RubIgG...   Hgb Fractionation Cascade   Varicella zoster antibody, IgG   POCT urine pregnancy   POCT Wet Prep [facility])   Meds ordered this encounter  Medications   DISCONTD: fluconazole (DIFLUCAN) 150 MG tablet    Sig: Take 1 tablet (150 mg total) by mouth once for 1 dose.    Dispense:  1 tablet    Refill:  0   clotrimazole (LOTRIMIN) 1 % cream    Sig: Apply 1 Application topically 2 (two) times daily for 7 days.    Dispense:  14 g    Refill:  0   Prenatal Vit-Fe Fumarate-FA (WESTAB PLUS) 27-1 MG TABS    Sig: Take 1 tablet by mouth daily.    Dispense:  30 tablet    Refill:  3    If you haven't already, sign up for My Chart to have easy access to your labs results, and communication with your primary care physician.  We are checking some labs today. If they are abnormal, I will call you. If they are normal, I will send you a MyChart message (if it is active) or a letter in the mail. If you do not hear about your labs in the next 2 weeks, please call the office.   You should return to our clinic Return for first prenatal visit . 03/08/22 @ 09:50AM  I recommend that you always bring  your medications to each appointment as this makes it easy to ensure you are on the correct medications and helps [place] not miss refills when you need them.  Please arrive 15 minutes before your appointment to ensure smooth check in process.  We appreciate your efforts in making this happen.  Please call the clinic at [phone] if your symptoms worsen or you have any concerns.  Thank you for allowing me to participate in your care, Zaedyn Covin [employer]

## 2022-03-03 ENCOUNTER — Ambulatory Visit (HOSPITAL_BASED_OUTPATIENT_CLINIC_OR_DEPARTMENT_OTHER)
Admission: RE | Admit: 2022-03-03 | Discharge: 2022-03-03 | Disposition: A | Discharge status: Home / Self Care | Payer: Medicaid Other | Source: Ambulatory Visit | Attending: Family Medicine | Admitting: Family Medicine

## 2022-03-03 ENCOUNTER — Other Ambulatory Visit: Payer: Self-pay | Admitting: Family Medicine

## 2022-03-03 DIAGNOSIS — Z3490 Encounter for supervision of normal pregnancy, unspecified, unspecified trimester: Secondary | ICD-10-CM | POA: Insufficient documentation

## 2022-03-03 DIAGNOSIS — Z3687 Encounter for antenatal screening for uncertain dates: Secondary | ICD-10-CM | POA: Insufficient documentation

## 2022-03-03 DIAGNOSIS — Z3A15 15 weeks gestation of pregnancy: Secondary | ICD-10-CM | POA: Insufficient documentation

## 2022-03-03 DIAGNOSIS — O26842 Uterine size-date discrepancy, second trimester: Secondary | ICD-10-CM | POA: Diagnosis not present

## 2022-03-06 LAB — CYTOLOGY - PAP
Chlamydia: NEGATIVE
Comment: NEGATIVE
Comment: NORMAL
Diagnosis: NEGATIVE
Neisseria Gonorrhea: NEGATIVE

## 2022-03-08 ENCOUNTER — Ambulatory Visit (INDEPENDENT_AMBULATORY_CARE_PROVIDER_SITE_OTHER): Payer: Medicaid Other | Admitting: Family Medicine

## 2022-03-08 ENCOUNTER — Encounter: Payer: Self-pay | Admitting: Family Medicine

## 2022-03-08 ENCOUNTER — Other Ambulatory Visit: Payer: Self-pay | Admitting: Family Medicine

## 2022-03-08 VITALS — BP 117/70 | HR 73 | Wt 171.6 lb

## 2022-03-08 DIAGNOSIS — B180 Chronic viral hepatitis B with delta-agent: Secondary | ICD-10-CM

## 2022-03-08 DIAGNOSIS — Z3A15 15 weeks gestation of pregnancy: Secondary | ICD-10-CM | POA: Diagnosis not present

## 2022-03-08 DIAGNOSIS — O98812 Other maternal infectious and parasitic diseases complicating pregnancy, second trimester: Secondary | ICD-10-CM

## 2022-03-08 DIAGNOSIS — B379 Candidiasis, unspecified: Secondary | ICD-10-CM | POA: Diagnosis not present

## 2022-03-08 DIAGNOSIS — O98412 Viral hepatitis complicating pregnancy, second trimester: Secondary | ICD-10-CM

## 2022-03-08 LAB — CBC/D/PLT+RPR+RH+ABO+RUBIGG...
Antibody Screen: NEGATIVE
Basophils Absolute: 0 10*3/uL (ref 0.0–0.2)
Basos: 0 %
Bilirubin, UA: NEGATIVE
EOS (ABSOLUTE): 0 10*3/uL (ref 0.0–0.4)
Eos: 1 %
Glucose, UA: NEGATIVE
HCV Ab: NONREACTIVE
HIV Screen 4th Generation wRfx: NONREACTIVE
Hematocrit: 38.3 % (ref 34.0–46.6)
Hemoglobin: 12.4 g/dL (ref 11.1–15.9)
Immature Grans (Abs): 0 10*3/uL (ref 0.0–0.1)
Immature Granulocytes: 0 %
Ketones, UA: NEGATIVE
Leukocytes,UA: NEGATIVE
Lymphocytes Absolute: 1.8 10*3/uL (ref 0.7–3.1)
Lymphs: 30 %
MCH: 24.7 pg — ABNORMAL LOW (ref 26.6–33.0)
MCHC: 32.4 g/dL (ref 31.5–35.7)
MCV: 76 fL — ABNORMAL LOW (ref 79–97)
Monocytes Absolute: 0.4 10*3/uL (ref 0.1–0.9)
Monocytes: 6 %
Neutrophils Absolute: 3.8 10*3/uL (ref 1.4–7.0)
Neutrophils: 63 %
Nitrite, UA: NEGATIVE
Platelets: 196 10*3/uL (ref 150–450)
RBC, UA: NEGATIVE
RBC: 5.03 x10E6/uL (ref 3.77–5.28)
RDW: 14.6 % (ref 11.7–15.4)
RPR Ser Ql: NONREACTIVE
Rh Factor: POSITIVE
Rubella Antibodies, IGG: 24.8 index (ref >0.99)
Specific Gravity, UA: 1.021 (ref 1.005–1.030)
Urobilinogen, Ur: 0.2 mg/dL (ref 0.2–1.0)
WBC: 6 10*3/uL (ref 3.4–10.8)
pH, UA: >=9 — AB (ref 5.0–7.5)

## 2022-03-08 LAB — HGB FRACTIONATION CASCADE
Hgb A2: 2.6 % (ref 1.8–3.2)
Hgb A: 97.4 % (ref 96.4–98.8)
Hgb F: 0 % (ref 0.0–2.0)
Hgb S: 0 %

## 2022-03-08 LAB — HCV INTERPRETATION

## 2022-03-08 LAB — VARICELLA ZOSTER ANTIBODY, IGG: Varicella zoster IgG: 1472 index (ref >165)

## 2022-03-08 LAB — MICROSCOPIC EXAMINATION
Bacteria, UA: NONE SEEN
Casts: NONE SEEN /lpf

## 2022-03-08 LAB — URINE CULTURE, OB REFLEX

## 2022-03-08 LAB — HEPATITIS B SURFACE AG, CONFIRM: HBsAG Confirmation: POSITIVE — AB

## 2022-03-08 MED ORDER — CLOTRIMAZOLE 1 % EX CREA: 1.0000 | TOPICAL_CREAM | Freq: Two times a day (BID) | CUTANEOUS | 0 refills | Status: AC

## 2022-03-08 NOTE — Progress Notes (Signed)
Patient Name: Jill Huff Date of Birth: [birth year] [facility] Initial Prenatal Visit  Jill Huff is a 31 y.o. year old G3P3003 at Unknown who presents for her initial prenatal visit. She reports  no concerns . She is taking a prenatal vitamin.  She denies pelvic pain or vaginal bleeding.   Pregnancy Dating: The patient is dated by: [place] [redacted]w[redacted]d LMP: sometime in March  Period is certain:  No.  Periods were regular:  No.  LMP was a typical period:  No.  Using hormonal contraception in 3 months prior to conception: No  Lab Review: Blood type: O Rh Status: + HIV: Negative RPR: Negative Hemoglobin electrophoresis reviewed: Yes Results of OB urine culture are: Negative Rubella: Immune Hep C Ab: Not done Varicella status is Immune  PMH: Reviewed and as detailed below: HTN: No  Gestational Hypertension/preeclampsia: No  Type 1 or 2 Diabetes: No  Depression:  No  Seizure disorder:  No VTE: No ,  History of STI No,  Abnormal Pap smear:  No, Genital herpes simplex:  No   PSH: Gynecologic Surgery:  no  Obstetric History: Obstetric history tab updated and reviewed.  Summary of prior pregnancies: Patient states prior pregnancies were uncomplicated. Has had 3 vaginal deliveries. Kids are 6, 4, 1 Cesarean delivery: No  Gestational Diabetes:  No Hypertension in pregnancy: No History of preterm birth: No History of LGA/SGA infant:  No History of shoulder dystocia: No Indications for referral were reviewed, and the patient has no obstetric indications for referral to High Risk OB Clinic at this time.   Social History: Tobacco use: No Alcohol use:  No Other substance use:  No  Current Medications:  Prenatal vitamin,  Tenofovir, Lotrimin cream Reviewed and appropriate in pregnancy.   Genetic and Infection Screen: Flow Sheet Updated Yes  Prenatal Exam: Gen: Well nourished, well developed.  No distress.  Vitals noted. HEENT: Normocephalic, atraumatic.   Neck supple without cervical lymphadenopathy, thyromegaly or thyroid nodules.  Fair dentition. CV: RRR no murmur, gallops or rubs Lungs: CTA B.  Normal respiratory effort without wheezes or rales. Abd: soft, NTND. +BS.  Uterus not appreciated above pelvis. GU: Normal external female genitalia without lesions.  Nl vaginal, well rugated without lesions. No vaginal discharge.  Bimanual exam: No adnexal mass or TTP. No CMT.   Ext: No clubbing, cyanosis or edema. Psych: Normal grooming and dress.  Not depressed or anxious appearing.  Normal thought content and process without flight of ideas or looseness of associations  Fetal heart tones: Not detected and discussed with preceptor, ultrasound obtained   Assessment/Plan:  Jill Huff is a 31 y.o. G3P3003 at Unknown who presents to initiate prenatal care. She is doing well.  Current pregnancy issues include chronic hepatitis B infection. Referring to high risk OB. Patient is asymptomatic without any concerns today.   Routine prenatal care: As dating is not reliable, a dating ultrasound was performed indicating GA [redacted]w[redacted]d. Pre-pregnancy weight updated. Expected weight gain this pregnancy is 11-20 pounds  Prenatal labs reviewed, notable for chronic hep B infection. Indications for referral to HROB were reviewed and the patient does meet criteria for referral.  Medication list reviewed and updated.  Recommended patient see a dentist for regular care.  Bleeding and pain precautions reviewed. Importance of prenatal vitamins reviewed.  Genetic screening offered. Patient opted for: no screening. The patient has the following indications for aspirinto begin 81 mg at 12-16 weeks: One high risk condition: no single high risk condition  MORE  than one moderate risk condition: identifies as African American  Aspirin was not  recommended today based upon above risk factors (one high risk condition or more than one moderate risk factor)  The patient will not  be age 54 or over at time of delivery.  The patient has the following risk factors for preexisting diabetes: BMI > 25 and high risk ethnicity (Latino, [place] American, Native American, [place] Islander, Asian Naval architect) . An early 1 hour glucose tolerance test was not ordered. PHQ-9 form completed, problems noted: No   2. Pregnancy issues include the following which were addressed today:   Chronic Hep B  Currently on Tenofovir 300mg  daily. Viral load <10. Sent a message to ID Dr.Van Dam to see if medication needs to be adjusted. Referral previously placed to high risk OB as well.    Yeast infection Did not get the Lotrimin cream previously prescribed. Re-ordered.

## 2022-03-08 NOTE — Patient Instructions (Signed)
It was great seeing you today!  You were seen for your initial OB visit and I am glad you are doing well!  We have referred you to our [facility] specialist due to your hepatitis B infection, and they will call you to schedule that appointment.  We are checking the levels of the virus in your blood today.  Go to the MAU at [facility] at [facility] if: You have pain in your lower abdomen or pelvic area Your water breaks.  Sometimes it is a big gush of fluid, sometimes it is just a trickle that keeps getting your underwear wet or running down your legs You have vaginal bleeding.   Feel free to call with any questions or concerns at any time, at [phone].   Take care,  Dr. Cora Collum [facility]  Ilikuwa nzuri [place] leo!  Debe Coder kwa ziara yako ya awali ya OB na ninafurahi kuwa unaendelea vizuri! Tumekuelekeza kwa mtaalamu wetu wa OB kutokana na maambukizi yako ya hepatitis B, na [place] ili [place] miadi hiyo. [place] viwango vya virusi [employer].  Nenda kwa MAU katika [place] na [place] huko Webb ikiwa: ? Una maumivu katika tumbo la chini au eneo la pelvic ? Maji yako yanakatika. [place] mwingine ni giligili [place], wakati mwingine ni mtirirko tu ambao unaendelea kupata chupi yako kulowa au kukimbia chini ya miguu yako. ? Una damu ukeni.  Jisikie huru [place] simu na maswali au wasiwasi wowote [place], [place] [phone].   Kuwa mwangalifu, Dk. Cora Collum [place] cha [place] cha Familia ya Afya ya [facility]

## 2022-03-08 NOTE — Progress Notes (Signed)
Referral to high risk OB gyn for chronic hepatitis B currently pregnant

## 2022-03-09 LAB — HEPATITIS B DNA, ULTRAQUANTITATIVE, PCR: HBV DNA SERPL PCR-ACNC: <10 IU/mL

## 2022-03-16 DIAGNOSIS — Z3A16 16 weeks gestation of pregnancy: Secondary | ICD-10-CM | POA: Diagnosis not present

## 2022-03-16 DIAGNOSIS — B180 Chronic viral hepatitis B with delta-agent: Secondary | ICD-10-CM | POA: Diagnosis not present

## 2022-03-16 DIAGNOSIS — O98412 Viral hepatitis complicating pregnancy, second trimester: Secondary | ICD-10-CM | POA: Diagnosis not present

## 2022-03-16 DIAGNOSIS — Z8619 Personal history of other infectious and parasitic diseases: Secondary | ICD-10-CM | POA: Diagnosis not present

## 2022-05-04 ENCOUNTER — Other Ambulatory Visit: Payer: Self-pay | Admitting: Family Medicine

## 2022-05-04 DIAGNOSIS — Z31430 Encounter of female for testing for genetic disease carrier status for procreative management: Secondary | ICD-10-CM | POA: Diagnosis not present

## 2022-05-04 DIAGNOSIS — Z3482 Encounter for supervision of other normal pregnancy, second trimester: Secondary | ICD-10-CM | POA: Diagnosis not present

## 2022-05-04 DIAGNOSIS — Z363 Encounter for antenatal screening for malformations: Secondary | ICD-10-CM

## 2022-05-04 DIAGNOSIS — Z113 Encounter for screening for infections with a predominantly sexual mode of transmission: Secondary | ICD-10-CM | POA: Diagnosis not present

## 2022-05-04 DIAGNOSIS — Z1388 Encounter for screening for disorder due to exposure to contaminants: Secondary | ICD-10-CM | POA: Diagnosis not present

## 2022-05-05 ENCOUNTER — Encounter: Payer: Self-pay | Admitting: *Deleted

## 2022-05-05 ENCOUNTER — Ambulatory Visit: Payer: Medicaid Other | Attending: Family Medicine

## 2022-05-05 ENCOUNTER — Ambulatory Visit: Payer: Medicaid Other | Admitting: *Deleted

## 2022-05-05 ENCOUNTER — Other Ambulatory Visit: Payer: Self-pay | Admitting: *Deleted

## 2022-05-05 VITALS — BP 109/68 | HR 72

## 2022-05-05 DIAGNOSIS — B1910 Unspecified viral hepatitis B without hepatic coma: Secondary | ICD-10-CM

## 2022-05-05 DIAGNOSIS — Z363 Encounter for antenatal screening for malformations: Secondary | ICD-10-CM | POA: Diagnosis not present

## 2022-05-05 DIAGNOSIS — O98419 Viral hepatitis complicating pregnancy, unspecified trimester: Secondary | ICD-10-CM | POA: Diagnosis not present

## 2022-05-05 DIAGNOSIS — Z3492 Encounter for supervision of normal pregnancy, unspecified, second trimester: Secondary | ICD-10-CM

## 2022-06-01 DIAGNOSIS — O09892 Supervision of other high risk pregnancies, second trimester: Secondary | ICD-10-CM | POA: Diagnosis not present

## 2022-06-01 DIAGNOSIS — Z23 Encounter for immunization: Secondary | ICD-10-CM | POA: Diagnosis not present

## 2022-06-01 DIAGNOSIS — Z0389 Encounter for observation for other suspected diseases and conditions ruled out: Secondary | ICD-10-CM | POA: Diagnosis not present

## 2022-06-01 DIAGNOSIS — Z113 Encounter for screening for infections with a predominantly sexual mode of transmission: Secondary | ICD-10-CM | POA: Diagnosis not present

## 2022-06-01 DIAGNOSIS — B3731 Acute candidiasis of vulva and vagina: Secondary | ICD-10-CM | POA: Diagnosis not present

## 2022-06-01 DIAGNOSIS — B181 Chronic viral hepatitis B without delta-agent: Secondary | ICD-10-CM | POA: Diagnosis not present

## 2022-06-01 DIAGNOSIS — D563 Thalassemia minor: Secondary | ICD-10-CM | POA: Diagnosis not present

## 2022-06-01 DIAGNOSIS — O0932 Supervision of pregnancy with insufficient antenatal care, second trimester: Secondary | ICD-10-CM | POA: Diagnosis not present

## 2022-06-02 ENCOUNTER — Other Ambulatory Visit: Payer: Self-pay | Admitting: *Deleted

## 2022-06-02 ENCOUNTER — Ambulatory Visit: Payer: Medicaid Other | Admitting: *Deleted

## 2022-06-02 ENCOUNTER — Ambulatory Visit: Payer: Medicaid Other | Attending: Maternal & Fetal Medicine

## 2022-06-02 VITALS — BP 111/66 | HR 79

## 2022-06-02 DIAGNOSIS — B1910 Unspecified viral hepatitis B without hepatic coma: Secondary | ICD-10-CM | POA: Diagnosis not present

## 2022-06-02 DIAGNOSIS — D569 Thalassemia, unspecified: Secondary | ICD-10-CM | POA: Diagnosis not present

## 2022-06-02 DIAGNOSIS — O289 Unspecified abnormal findings on antenatal screening of mother: Secondary | ICD-10-CM

## 2022-06-02 DIAGNOSIS — O285 Abnormal chromosomal and genetic finding on antenatal screening of mother: Secondary | ICD-10-CM | POA: Diagnosis not present

## 2022-06-02 DIAGNOSIS — Z3492 Encounter for supervision of normal pregnancy, unspecified, second trimester: Secondary | ICD-10-CM

## 2022-06-02 DIAGNOSIS — Z3A27 27 weeks gestation of pregnancy: Secondary | ICD-10-CM

## 2022-06-02 DIAGNOSIS — D563 Thalassemia minor: Secondary | ICD-10-CM

## 2022-06-02 DIAGNOSIS — O98419 Viral hepatitis complicating pregnancy, unspecified trimester: Secondary | ICD-10-CM | POA: Diagnosis not present

## 2022-06-07 ENCOUNTER — Ambulatory Visit: Payer: Medicaid Other

## 2022-06-08 ENCOUNTER — Ambulatory Visit: Payer: Medicaid Other | Attending: Obstetrics and Gynecology

## 2022-06-08 DIAGNOSIS — D563 Thalassemia minor: Secondary | ICD-10-CM

## 2022-06-08 NOTE — Progress Notes (Signed)
[facility] at [facility], [address] Phone:  [phone]   Fax:  [phone]    Name: Jackolyn Geron Indication: Discuss maternal carrier screening results  DOB: 1991-02-15 Age: 31 y.o.   EDC: 08/26/2022 LMP: 12/03/2021 Referring Provider:  McDiarmid, Blane Ohara, MD  EGA: [redacted]w[redacted]d Genetic Counselor: Staci Righter, MS, CGC  OB Hx: [id number] Date of Appointment: 06/08/2022  Accompanied by: [name] interpreter Face to Face Time: 75 Minutes   Previous Testing Completed: Kymorah previously completed cell-free DNA screening (cfDNA) in this pregnancy. The result is low risk, consistent with a female fetus. This screening significantly reduces the risk that the current pregnancy has Down syndrome, Trisomy 94, Trisomy 61, and common sex chromosome conditions, however, the risk is not zero given the limitations of cfDNA. Additionally, there are many genetic conditions that cannot be detected by cfDNA.  Punam previously completed carrier screening. She screened to have an increased chance to be a silent carrier for Spinal Muscular Atrophy (SMA). She also screened to be a silent carrier for alpha thalassemia. She screened to not be a carrier for Cystic Fibrosis (CF) and beta hemoglobinopathies. A negative result on carrier screening reduces the likelihood of being a carrier, however, does not entirely rule out the possibility.   Medical History:  This is Sameeha's 4th pregnancy. She has 3 living children. Reports she takes prenatal vitamins and treatment for Hepatitis B. Denies personal history of diabetes, high blood pressure, thyroid conditions, and seizures. Denies bleeding, infections, and fevers in this pregnancy. Denies using tobacco, alcohol, or street drugs in this pregnancy.   Family History: A pedigree was created and scanned into Epic under the Media tab. Leonor reports she and her husband are from the [place]. She denies consanguinity. Family  history not remarkable for individuals with birth defects, intellectual disability, autism spectrum disorder, multiple spontaneous abortions, still births, or unexplained neonatal death.     Genetic Counseling:   Increased Risk to be a Silent Carrier of Spinal Muscular Atrophy (SMA). SMA is an autosomal recessive disease caused by loss of function mutations in the SMN1 gene. Suriyah screened to have two functional copies of the SMN1 gene, however, due to limitations of genetic screening the laboratory cannot confirm if Espyn's two copies are on the same chromosome or on opposite chromosomes. The laboratory identified the variant [id number] in Nicle's screening, meaning there is increased risk for Makayli's two copies of the SMN1 gene to be on the same chromosome. Specifically, given Tomekia's ethnic background, her risk to be a silent carrier is approximately 1 in 51. Genetic counseling reviewed with Aury that if her two copies are on the same chromosome that means her other chromosome would have zero copies, and there could be risk for an affected pregnancy if her reproductive partner is found to be a carrier for SMA. Individuals with SMA have progressive, proximal, and symmetrical muscle weakness and atrophy due to degeneration of motor neurons of the spine and brainstem. Symptoms can first appear at any time between before birth to adulthood. There are five types of SMA that are historically classified based on clinical presentation, onset of symptoms, and the maximum skill level of motor milestones achieved, however, there is a lot of overlap between types. There are several treatments available for individuals affected with SMA and studies show that treatment is most effective when it is started in the first few months of life. Given Yazmen's carrier screening result, genetic counseling offered screening for her  reproductive partner for SMA. Shylo verbalized understanding of the information  discussed and stated she will talk to her partner to see if he is interested in this screening. SMA is included in [place]'s newborn screening program.  Maternal Silent Carrier for Alpha Thalassemia. Alpha Thalassemia refers to a group of autosomal recessive blood disorders that reduce the amount of hemoglobin, the protein in red blood cells that carries oxygen to tissues throughout the body. Hemoglobin is made up of both alpha globin and beta globin proteins. Alpha Thalassemia is different in its inheritance compared to other hemoglobinopathies as there are two alpha-globin genes on each chromosome 16 (??/??). Alpha Thalassemia occurs when three or more of the four alpha-globin genes are deleted or changed. Merly is a silent carrier for alpha thalassemia (??/?-) caused by the pathogenic alpha 3.7 deletion of the HBA2 gene. With this result, we know that Shaquela has three functional copies of the alpha-globin genes while her 4th alpha-globin gene is deleted. Each of Kaycie's children will either inherit two functional genes (??) or one functional gene and one deletion (?-) from her.  Dewanda is not at increased risk to have a baby with fetal hydrops due to Hemoglobin Barts disease (four deleted or changed alpha-globin genes: --/--) regardless of her reproductive partner's carrier status.  If Ndidi's reproductive partner is found to be an Alpha Thalassemia carrier in the cis configuration (two deleted or changed alpha-globin genes on the same chromosome: ??/--) there would be a 25% risk for the current pregnancy to be affected with Hemoglobin H Disease (three deleted or changed alpha-globin genes: --/?-). Clinical features of this condition are highly variable and generally develop in the first years of life. People with severe symptoms may have chronic anemia, liver disease, and bone changes. Some affected individuals do not require blood transfusions while others may require occasional blood  transfusions throughout their lifetime.  Given Katena's carrier screening result, carrier screening for her reproductive partner was offered to determine risk for the current pregnancy. Deysy verbalized understanding of the information discussed and stated she will talk to her partner to see if he is interested in this screening.      Patient Plan:  Proceed with: Malayna verbalized understanding of all the information discussed and stated she will talk to her partner to see if he is interested in carrier screening for Alpha Thalassemia and SMA. We provided T[place]ney specific instructions on how to contact genetic counseling if her partner desires carrier screening.  All questions were answered.    Thank you for sharing in the care of [place] with [place].  Please do not hesitate to contact us if you have any questions.  Teena Dunk, MS, [facility]

## 2022-06-13 ENCOUNTER — Other Ambulatory Visit: Payer: Self-pay

## 2022-07-03 ENCOUNTER — Other Ambulatory Visit: Payer: Self-pay | Admitting: *Deleted

## 2022-07-03 ENCOUNTER — Ambulatory Visit: Payer: Medicaid Other | Admitting: *Deleted

## 2022-07-03 ENCOUNTER — Ambulatory Visit: Payer: Medicaid Other | Attending: Obstetrics

## 2022-07-03 VITALS — BP 110/70 | HR 83

## 2022-07-03 DIAGNOSIS — D563 Thalassemia minor: Secondary | ICD-10-CM

## 2022-07-03 DIAGNOSIS — O285 Abnormal chromosomal and genetic finding on antenatal screening of mother: Secondary | ICD-10-CM

## 2022-07-03 DIAGNOSIS — B1910 Unspecified viral hepatitis B without hepatic coma: Secondary | ICD-10-CM | POA: Diagnosis not present

## 2022-07-03 DIAGNOSIS — Z3A32 32 weeks gestation of pregnancy: Secondary | ICD-10-CM

## 2022-07-03 DIAGNOSIS — Z3689 Encounter for other specified antenatal screening: Secondary | ICD-10-CM | POA: Insufficient documentation

## 2022-07-03 DIAGNOSIS — O289 Unspecified abnormal findings on antenatal screening of mother: Secondary | ICD-10-CM | POA: Diagnosis not present

## 2022-07-03 DIAGNOSIS — O98419 Viral hepatitis complicating pregnancy, unspecified trimester: Secondary | ICD-10-CM | POA: Insufficient documentation

## 2022-07-31 ENCOUNTER — Ambulatory Visit: Payer: Medicaid Other

## 2022-07-31 ENCOUNTER — Ambulatory Visit: Payer: Medicaid Other | Attending: Obstetrics and Gynecology

## 2022-07-31 DIAGNOSIS — Z3483 Encounter for supervision of other normal pregnancy, third trimester: Secondary | ICD-10-CM | POA: Diagnosis not present

## 2022-07-31 DIAGNOSIS — Z113 Encounter for screening for infections with a predominantly sexual mode of transmission: Secondary | ICD-10-CM | POA: Diagnosis not present

## 2022-07-31 DIAGNOSIS — O09893 Supervision of other high risk pregnancies, third trimester: Secondary | ICD-10-CM | POA: Diagnosis not present

## 2022-07-31 DIAGNOSIS — O99013 Anemia complicating pregnancy, third trimester: Secondary | ICD-10-CM | POA: Diagnosis not present

## 2022-08-10 ENCOUNTER — Ambulatory Visit: Payer: Medicaid Other | Attending: Obstetrics and Gynecology

## 2022-08-10 ENCOUNTER — Ambulatory Visit: Payer: Medicaid Other | Admitting: *Deleted

## 2022-08-10 VITALS — BP 119/67 | HR 84

## 2022-08-10 DIAGNOSIS — Z148 Genetic carrier of other disease: Secondary | ICD-10-CM

## 2022-08-10 DIAGNOSIS — O98419 Viral hepatitis complicating pregnancy, unspecified trimester: Secondary | ICD-10-CM | POA: Diagnosis not present

## 2022-08-10 DIAGNOSIS — O99891 Other specified diseases and conditions complicating pregnancy: Secondary | ICD-10-CM | POA: Diagnosis not present

## 2022-08-10 DIAGNOSIS — Z3A37 37 weeks gestation of pregnancy: Secondary | ICD-10-CM

## 2022-08-10 DIAGNOSIS — B1910 Unspecified viral hepatitis B without hepatic coma: Secondary | ICD-10-CM | POA: Diagnosis not present

## 2022-08-10 DIAGNOSIS — B181 Chronic viral hepatitis B without delta-agent: Secondary | ICD-10-CM

## 2022-08-10 DIAGNOSIS — O98413 Viral hepatitis complicating pregnancy, third trimester: Secondary | ICD-10-CM | POA: Diagnosis not present

## 2022-08-28 DIAGNOSIS — O283 Abnormal ultrasonic finding on antenatal screening of mother: Secondary | ICD-10-CM | POA: Diagnosis not present

## 2022-08-28 DIAGNOSIS — Z3483 Encounter for supervision of other normal pregnancy, third trimester: Secondary | ICD-10-CM | POA: Diagnosis not present

## 2022-08-28 DIAGNOSIS — O99013 Anemia complicating pregnancy, third trimester: Secondary | ICD-10-CM | POA: Diagnosis not present

## 2022-08-28 DIAGNOSIS — D563 Thalassemia minor: Secondary | ICD-10-CM | POA: Diagnosis not present

## 2022-08-28 DIAGNOSIS — Z3490 Encounter for supervision of normal pregnancy, unspecified, unspecified trimester: Secondary | ICD-10-CM | POA: Diagnosis not present

## 2022-08-28 DIAGNOSIS — O480 Post-term pregnancy: Secondary | ICD-10-CM | POA: Diagnosis not present

## 2022-08-28 DIAGNOSIS — M21939 Unspecified acquired deformity of unspecified forearm: Secondary | ICD-10-CM | POA: Diagnosis not present

## 2022-08-28 DIAGNOSIS — B181 Chronic viral hepatitis B without delta-agent: Secondary | ICD-10-CM | POA: Diagnosis not present

## 2022-08-28 DIAGNOSIS — Z23 Encounter for immunization: Secondary | ICD-10-CM | POA: Diagnosis not present

## 2022-08-29 DIAGNOSIS — Z3483 Encounter for supervision of other normal pregnancy, third trimester: Secondary | ICD-10-CM | POA: Diagnosis not present

## 2022-08-30 ENCOUNTER — Inpatient Hospital Stay (HOSPITAL_COMMUNITY)
Admission: AD | Admit: 2022-08-30 | Discharge: 2022-09-01 | DRG: 806 | Disposition: A | Discharge status: Home / Self Care | Payer: Medicaid Other | Attending: Obstetrics and Gynecology | Admitting: Obstetrics and Gynecology

## 2022-08-30 ENCOUNTER — Encounter (HOSPITAL_COMMUNITY): Payer: Self-pay | Admitting: Obstetrics and Gynecology

## 2022-08-30 ENCOUNTER — Other Ambulatory Visit: Payer: Self-pay

## 2022-08-30 DIAGNOSIS — Z148 Genetic carrier of other disease: Secondary | ICD-10-CM | POA: Diagnosis not present

## 2022-08-30 DIAGNOSIS — Z603 Acculturation difficulty: Secondary | ICD-10-CM | POA: Diagnosis present

## 2022-08-30 DIAGNOSIS — Z3A40 40 weeks gestation of pregnancy: Secondary | ICD-10-CM | POA: Diagnosis not present

## 2022-08-30 DIAGNOSIS — O480 Post-term pregnancy: Principal | ICD-10-CM | POA: Diagnosis present

## 2022-08-30 DIAGNOSIS — B181 Chronic viral hepatitis B without delta-agent: Secondary | ICD-10-CM | POA: Diagnosis not present

## 2022-08-30 DIAGNOSIS — Z8619 Personal history of other infectious and parasitic diseases: Secondary | ICD-10-CM | POA: Diagnosis present

## 2022-08-30 DIAGNOSIS — B180 Chronic viral hepatitis B with delta-agent: Secondary | ICD-10-CM | POA: Diagnosis present

## 2022-08-30 DIAGNOSIS — Z3490 Encounter for supervision of normal pregnancy, unspecified, unspecified trimester: Secondary | ICD-10-CM

## 2022-08-30 DIAGNOSIS — O9842 Viral hepatitis complicating childbirth: Secondary | ICD-10-CM | POA: Diagnosis not present

## 2022-08-30 DIAGNOSIS — Z789 Other specified health status: Secondary | ICD-10-CM | POA: Diagnosis present

## 2022-08-30 DIAGNOSIS — O322XX0 Maternal care for transverse and oblique lie, not applicable or unspecified: Secondary | ICD-10-CM | POA: Diagnosis not present

## 2022-08-30 LAB — RPR: RPR Ser Ql: NONREACTIVE

## 2022-08-30 LAB — TYPE AND SCREEN
ABO/RH(D): O POS
Antibody Screen: NEGATIVE

## 2022-08-30 LAB — CBC
HCT: 44.3 % (ref 36.0–46.0)
Hemoglobin: 14.3 g/dL (ref 12.0–15.0)
MCH: 26.2 pg (ref 26.0–34.0)
MCHC: 32.3 g/dL (ref 30.0–36.0)
MCV: 81.1 fL (ref 80.0–100.0)
Platelets: 153 10*3/uL (ref 150–400)
RBC: 5.46 MIL/uL — ABNORMAL HIGH (ref 3.87–5.11)
RDW: 15.6 % — ABNORMAL HIGH (ref 11.5–15.5)
WBC: 7.1 10*3/uL (ref 4.0–10.5)
nRBC: 0 % (ref 0.0–0.2)

## 2022-08-30 MED ORDER — FENTANYL CITRATE (PF) 100 MCG/2ML IJ SOLN: 100.0000 ug | INTRAMUSCULAR | Status: DC | PRN

## 2022-08-30 MED ORDER — OXYCODONE-ACETAMINOPHEN 5-325 MG PO TABS: 2.0000 | ORAL_TABLET | ORAL | Status: DC | PRN

## 2022-08-30 MED ORDER — ACETAMINOPHEN 325 MG PO TABS
650.0000 mg | ORAL_TABLET | ORAL | Status: DC | PRN
  Administered 2022-08-30 (×2): 650 mg via ORAL
  Filled 2022-08-30 (×2): qty 2

## 2022-08-30 MED ORDER — TENOFOVIR DISOPROXIL FUMARATE 300 MG PO TABS
300.0000 mg | ORAL_TABLET | Freq: Every day | ORAL | Status: DC
  Administered 2022-08-30 – 2022-09-01 (×3): 300 mg via ORAL
  Filled 2022-08-30 (×3): qty 1

## 2022-08-30 MED ORDER — ACETAMINOPHEN 325 MG PO TABS
650.0000 mg | ORAL_TABLET | ORAL | Status: DC | PRN
  Administered 2022-08-30: 650 mg via ORAL
  Filled 2022-08-30: qty 2

## 2022-08-30 MED ORDER — ONDANSETRON HCL 4 MG/2ML IJ SOLN: 4.0000 mg | Freq: Four times a day (QID) | INTRAMUSCULAR | Status: DC | PRN

## 2022-08-30 MED ORDER — ZOLPIDEM TARTRATE 5 MG PO TABS: 5.0000 mg | ORAL_TABLET | Freq: Every evening | ORAL | Status: DC | PRN

## 2022-08-30 MED ORDER — COCONUT OIL OIL: 1.0000 | TOPICAL_OIL | Status: DC | PRN

## 2022-08-30 MED ORDER — SOD CITRATE-CITRIC ACID 500-334 MG/5ML PO SOLN: 30.0000 mL | ORAL | Status: DC | PRN

## 2022-08-30 MED ORDER — OXYTOCIN BOLUS FROM INFUSION
333.0000 mL | Freq: Once | INTRAVENOUS | Status: AC
  Administered 2022-08-30: 333 mL via INTRAVENOUS

## 2022-08-30 MED ORDER — SIMETHICONE 80 MG PO CHEW: 80.0000 mg | CHEWABLE_TABLET | ORAL | Status: DC | PRN

## 2022-08-30 MED ORDER — SENNOSIDES-DOCUSATE SODIUM 8.6-50 MG PO TABS
2.0000 | ORAL_TABLET | ORAL | Status: DC
  Administered 2022-08-30 – 2022-08-31 (×2): 2 via ORAL
  Filled 2022-08-30 (×2): qty 2

## 2022-08-30 MED ORDER — FLEET ENEMA 7-19 GM/118ML RE ENEM: 1.0000 | ENEMA | RECTAL | Status: DC | PRN

## 2022-08-30 MED ORDER — DIPHENHYDRAMINE HCL 25 MG PO CAPS: 25.0000 mg | ORAL_CAPSULE | Freq: Four times a day (QID) | ORAL | Status: DC | PRN

## 2022-08-30 MED ORDER — PRENATAL MULTIVITAMIN CH
1.0000 | ORAL_TABLET | Freq: Every day | ORAL | Status: DC
  Administered 2022-08-31 – 2022-09-01 (×2): 1 via ORAL
  Filled 2022-08-30 (×2): qty 1

## 2022-08-30 MED ORDER — ONDANSETRON HCL 4 MG PO TABS: 4.0000 mg | ORAL_TABLET | ORAL | Status: DC | PRN

## 2022-08-30 MED ORDER — IBUPROFEN 600 MG PO TABS
600.0000 mg | ORAL_TABLET | Freq: Four times a day (QID) | ORAL | Status: DC
  Administered 2022-08-30 – 2022-09-01 (×7): 600 mg via ORAL
  Filled 2022-08-30 (×8): qty 1

## 2022-08-30 MED ORDER — DIBUCAINE (PERIANAL) 1 % EX OINT: 1.0000 | TOPICAL_OINTMENT | CUTANEOUS | Status: DC | PRN

## 2022-08-30 MED ORDER — HYDROXYZINE HCL 50 MG PO TABS: 50.0000 mg | ORAL_TABLET | Freq: Four times a day (QID) | ORAL | Status: DC | PRN

## 2022-08-30 MED ORDER — LACTATED RINGERS IV SOLN: 500.0000 mL | INTRAVENOUS | Status: DC | PRN

## 2022-08-30 MED ORDER — LACTATED RINGERS IV SOLN: INTRAVENOUS | Status: DC

## 2022-08-30 MED ORDER — OXYCODONE HCL 5 MG PO TABS
5.0000 mg | ORAL_TABLET | ORAL | Status: DC | PRN
  Administered 2022-08-30: 10 mg via ORAL
  Administered 2022-08-31: 5 mg via ORAL
  Administered 2022-08-31: 10 mg via ORAL
  Administered 2022-08-31: 5 mg via ORAL
  Administered 2022-09-01: 10 mg via ORAL
  Filled 2022-08-30 (×2): qty 2
  Filled 2022-08-30 (×2): qty 1
  Filled 2022-08-30: qty 2

## 2022-08-30 MED ORDER — OXYTOCIN-SODIUM CHLORIDE 30-0.9 UT/500ML-% IV SOLN
2.5000 [IU]/h | INTRAVENOUS | Status: DC
  Administered 2022-08-30: 2.5 [IU]/h via INTRAVENOUS
  Filled 2022-08-30: qty 500

## 2022-08-30 MED ORDER — LIDOCAINE HCL (PF) 1 % IJ SOLN: 30.0000 mL | INTRAMUSCULAR | Status: DC | PRN

## 2022-08-30 MED ORDER — WITCH HAZEL-GLYCERIN EX PADS: 1.0000 | MEDICATED_PAD | CUTANEOUS | Status: DC | PRN

## 2022-08-30 MED ORDER — OXYCODONE-ACETAMINOPHEN 5-325 MG PO TABS: 1.0000 | ORAL_TABLET | ORAL | Status: DC | PRN

## 2022-08-30 MED ORDER — BENZOCAINE-MENTHOL 20-0.5 % EX AERO
1.0000 | INHALATION_SPRAY | CUTANEOUS | Status: DC | PRN
  Administered 2022-08-31: 1 via TOPICAL
  Filled 2022-08-30: qty 56

## 2022-08-30 MED ORDER — ONDANSETRON HCL 4 MG/2ML IJ SOLN: 4.0000 mg | INTRAMUSCULAR | Status: DC | PRN

## 2022-08-30 MED ORDER — TETANUS-DIPHTH-ACELL PERTUSSIS 5-2.5-18.5 LF-MCG/0.5 IM SUSY: 0.5000 mL | PREFILLED_SYRINGE | Freq: Once | INTRAMUSCULAR | Status: DC

## 2022-08-30 NOTE — H&P (Signed)
Jill Huff is a 31 y.o. G40P3003 female at [redacted]w[redacted]d by 23wk u/s presenting in active labor.   Reports active fetal movement, contractions: regular, vaginal bleeding: none, membranes: intact.  Initiated prenatal care at [facility] then transferred to [facility] at 21 wks.   Most recent u/s 11/30 @ 37.5wks, EFW 74%/3441g/7lb9oz.   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   Subchorionic hematoma in first trimester 04/12/2020   Threatened miscarriage 04/12/2020    Past Surgical History: Past Surgical History:  Procedure Laterality Date   NO PAST SURGERIES      Obstetrical History: OB History     Gravida  4   Para  3   Term  3   Preterm  0   AB  0   Living  3      SAB  0   IAB  0   Ectopic  0   Multiple  0   Live Births  1           Social History: Social History   Socioeconomic History   Marital status: Unknown    Spouse name: Not on file   Number of children: Not on file   Years of education: Not on file   Highest education level: Not on file  Occupational History   Not on file  Tobacco Use   Smoking status: Never   Smokeless tobacco: Never  Vaping Use   Vaping Use: Never used  Substance and Sexual Activity   Alcohol use: Yes    Comment: 1 bottle of wine per week   Drug use: No   Sexual activity: Yes  Other Topics Concern   Not on file  Social History Narrative   ** Merged History Encounter **       Social Determinants of Health   Financial Resource  Strain: Not on file  Food Insecurity: Not on file  Transportation Needs: Not on file  Physical Activity: Not on file  Stress: Not on file  Social Connections: Not on file    Family History: Family History  Problem Relation Age of Onset   Diabetes Neg Hx    Hypertension Neg Hx     Allergies: No Known Allergies  Medications Prior to Admission  Medication Sig Dispense Refill Last Dose   Prenatal Vit-Fe Fumarate-FA (WESTAB PLUS) 27-1 MG TABS Take 1 tablet by mouth daily. 30 tablet 3    tenofovir (VIREAD) 300 MG tablet Take by mouth.       Review of Systems  Pertinent pos/neg as indicated in HPI  Blood pressure (!) 108/58, pulse 76, temperature 97.6 F (36.4 C), temperature source Oral, resp. rate 16, height 5\' 4"  (1.626 m), weight 82 kg, last menstrual period 12/03/2021, currently breastfeeding. General appearance: alert and cooperative Lungs: clear to auscultation bilaterally Heart: regular rate and rhythm Abdomen: gravid, soft, non-tender  Fetal monitoring: FHR: 145 bpm, variability: minimal ,  Accelerations: Present,  decelerations:  Present  earlies Uterine activity: q 2-7 mins Dilation: 7.5 Station: -2 Exam by:: [email or web address]  RN Presentation: cephalic   Prenatal labs: ABO, Rh: O/Positive/-- (06/22 1032) Antibody: Negative (06/22 1032) Rubella: 24.80 (06/22 1032) RPR: Non Reactive (06/22 1032)  HBsAg: Confirm. indicated (06/22 1032)  HIV: Non Reactive (06/22 1032)  GBS:   neg 1hr glucola: 111  No results found for this or any previous visit (from the past 24 hour(s)).   Assessment:  [redacted]w[redacted]d SIUP  [id number]  Active labor  Cat 2 FHR (d/t minimal variability)  GBS  neg  Chronic HepB  Plan:  Admit to L&D  IV pain meds/epidural prn active labor  Expectant management  Last HepB viral load in July undetectable, has been on tenofovir  Anticipate NSVB     Cheral Marker CNM, WHNP-BC 08/30/2022, 7:12 AM

## 2022-08-30 NOTE — Lactation Note (Signed)
This note was copied from a baby's chart. Lactation Consultation Note  Patient Name: Girl Manessa Buley OACZY'S Date: 08/30/2022 Reason for consult: L&D Initial assessment;Term (LC L/D visit @ 30 mins PP, Baby STS on moms chest, mom resting,dad interpreting and mentioned the reason baby had 10 ml was due to no milk. Mom exp x 3 babies BF. LC offered to latch, baby latched, few sucks. STS with mom .) Age:29 mins  Parents aware for F/U on MBU.  Maternal Data Does the patient have breastfeeding experience prior to this delivery?: Yes How long did the patient breastfeed?: per dad breast feeding range 1-3 years with 3 other babies  Feeding Mother's Current Feeding Choice: Breast Milk and Formula  LATCH Score Latch: Grasps breast easily, tongue down, lips flanged, rhythmical sucking.  Audible Swallowing: None  Type of Nipple: Everted at rest and after stimulation  Comfort (Breast/Nipple): Soft / non-tender  Hold (Positioning): Full assist, staff holds infant at breast  LATCH Score: 6   Lactation Tools Discussed/Used    Interventions Interventions: Breast feeding basics reviewed;Assisted with latch;Skin to skin;Adjust position;Education  Discharge    Consult Status Consult Status: Follow-up from L&D Date: 08/30/22 Follow-up type: In-patient    Matilde Sprang Enda Santo 08/30/2022, 12:46 PM

## 2022-08-30 NOTE — MAU Note (Signed)
Pt says UC's  Was at Dr office on Monday  Ve 1 cm

## 2022-08-30 NOTE — Discharge Summary (Signed)
Postpartum Discharge Summary    Patient Name: Jill Huff DOB: Apr 11, 1991 MRN: [id number]  Date of admission: 08/30/2022 Delivery date:08/30/2022  Delivering provider: Concepcion Living  Date of discharge: 09/01/2022  Admitting diagnosis: Normal labor [O80, Z37.9] Intrauterine pregnancy: [redacted]w[redacted]d    Secondary diagnosis:  Active Problems:   [place]   Language barrier   History of hepatitis D virus infection   Pregnancy   Normal labor   Vaginal delivery   Shoulder dystocia during labor and delivery  Additional problems: None    Discharge diagnosis: Term Pregnancy Delivered                                              Post partum procedures: methergine series Augmentation: AROM Complications: None  Hospital course: Onset of Labor With Vaginal Delivery      31y.o. yo [id number] admitted in Active Labor on 08/30/2022. Labor course was complicated by 30 to 45 seconds shoulder dystocia requiring McRoberts and modified with screw for resolution of the shoulder dystocia. Membrane Rupture Time/Date: 10:37 AM ,08/30/2022   Delivery Method:Vaginal, Spontaneous  Episiotomy: None  Lacerations:  None  Patient had a postpartum course complicated by completing a methergine series for pp bleeding.  She is ambulating, tolerating a regular diet, passing flatus, and urinating well. Patient is discharged home in stable condition on 09/01/22.  Newborn Data: Birth date:08/30/2022  Birth time:11:52 AM  Gender:Female  Living status:Living  Apgars:9 ,9  Weight:3700 g   Magnesium Sulfate received: No BMZ received: No Rhophylac:N/A MMR:No T-DaP:Given prenatally Flu: No Transfusion:No  Physical exam  Vitals:   08/31/22 0300 08/31/22 1440 08/31/22 1957 09/01/22 0506  BP: 111/72 107/65 122/88 115/81  Pulse: 66 64 67 63  Resp: _0 Temp: 98 F (36.7 C) 97.6 F (36.4 C) 98.3 F (36.8 C) 97.7 F (36.5 C)  TempSrc: Oral Oral  Axillary   SpO2: 100% 100%  100%  Weight:      Height:       General: alert, cooperative, and no distress Lochia: appropriate Uterine Fundus: firm Incision: N/A DVT Evaluation: No evidence of DVT seen on physical exam. Labs: Lab Results  Component Value Date   WBC 7.1 08/30/2022   HGB 14.3 08/30/2022   HCT 44.3 08/30/2022   MCV 81.1 08/30/2022   PLT 153 08/30/2022      Latest Ref Rng & Units 01/17/2021    2:54 PM  CMP  Glucose 65 - 99 mg/dL 86   BUN 7 - 25 mg/dL 10   Creatinine 0.50 - 1.10 mg/dL 0.64   Sodium 135 - 146 mmol/L 139   Potassium 3.5 - 5.3 mmol/L 4.0   Chloride 98 - 110 mmol/L 104   CO2 20 - 32 mmol/L 28   Calcium 8.6 - 10.2 mg/dL 9.5   Total Protein 6.1 - 8.1 g/dL 7.4   Total Bilirubin 0.2 - 1.2 mg/dL 0.4   AST 10 - 30 U/L 25   ALT 6 - 29 U/L 26    Edinburgh Score:    12/01/2020   11:59 AM  Edinburgh Postnatal Depression Scale Screening Tool  I have been able to laugh and see the funny side of things. 0  I have looked forward with enjoyment to things. 0  I have blamed myself unnecessarily when things  went wrong. 0  I have been anxious or worried for no good reason. 0  I have felt scared or panicky for no good reason. 0  Things have been getting on top of me. 0  I have been so unhappy that I have had difficulty sleeping. 0  I have felt sad or miserable. 0  I have been so unhappy that I have been crying. 0  The thought of harming myself has occurred to me. 0  Edinburgh Postnatal Depression Scale Total 0     After visit meds:  Allergies as of 09/01/2022   No Known Allergies      Medication List     TAKE these medications    acetaminophen 325 MG tablet Commonly known as: Tylenol Take 2 tablets (650 mg total) by mouth every 4 (four) hours as needed (for pain scale < 4).   benzocaine-Menthol 20-0.5 % Aero Commonly known as: DERMOPLAST Apply 1 Application topically as needed for irritation (perineal discomfort).   ibuprofen 600 MG  tablet Commonly known as: ADVIL Take 1 tablet (600 mg total) by mouth every 6 (six) hours.   senna-docusate 8.6-50 MG tablet Commonly known as: Senokot-S Take 2 tablets by mouth daily.   tenofovir 300 MG tablet Commonly known as: VIREAD Take by mouth.   WesTab Plus 27-1 MG Tabs Take 1 tablet by mouth daily.         Discharge home in stable condition Infant Feeding: Bottle and Breast Infant Disposition:home with mother Discharge instruction: per After Visit Summary and Postpartum booklet. Activity: Advance as tolerated. Pelvic rest for 6 weeks.  Diet: routine diet Future Appointments:No future appointments. 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  Please schedule this patient for a In person postpartum visit in 4 weeks with the following provider: Any provider. Additional Postpartum F/U: None   Low risk pregnancy complicated by:  Chronic hepatitis B Delivery mode:  Vaginal, Spontaneous  Anticipated Birth Control:  Unsure   09/01/2022 Shelda Pal, DO

## 2022-08-30 NOTE — Lactation Note (Signed)
This note was copied from a baby's chart. Lactation Consultation Note  Patient Name: Jill Huff VKPQA'E Date: 08/30/2022   Age:29 hours  LC attempted to visit with the birth parent, but the iPad would not connect with an interpreter. Lactation will follow up later.   Maternal Data    Feeding    LATCH Score                    Lactation Tools Discussed/Used    Interventions    Discharge    Consult Status      [name] 08/30/2022, 8:52 PM

## 2022-08-30 NOTE — Plan of Care (Signed)

## 2022-08-30 NOTE — Progress Notes (Signed)
Patient ID: Jill Huff, female   DOB: December 22, 1990, 31 y.o.   MRN: [id number] Called by RN re: having heavier lochia with sev small clots Also having afterpains unrelieved by ibuprofen  Results for orders placed or performed during the hospital encounter of 08/30/22 (from the past 24 hour(s))  CBC     Status: Abnormal   Collection Time: 08/30/22  7:06 AM  Result Value Ref Range   WBC 7.1 4.0 - 10.5 K/uL   RBC 5.46 (H) 3.87 - 5.11 MIL/uL   Hemoglobin 14.3 12.0 - 15.0 g/dL   HCT 34.1 93.7 - 90.2 %   MCV 81.1 80.0 - 100.0 fL   MCH 26.2 26.0 - 34.0 pg   MCHC 32.3 30.0 - 36.0 g/dL   RDW 40.9 (H) 73.5 - 32.9 %   Platelets 153 150 - 400 K/uL   nRBC 0.0 0.0 - 0.2 %  RPR     Status: None   Collection Time: 08/30/22  7:06 AM  Result Value Ref Range   RPR Ser Ql NON REACTIVE NON REACTIVE  Type and screen [facility]     Status: None   Collection Time: 08/30/22  7:08 AM  Result Value Ref Range   ABO/RH(D) O POS    Antibody Screen NEG    Sample Expiration      09/02/2022,2359 Performed at [facility], [address], [place], [place] [address]    Will order Oxy IR prn breakthrough pain Recommend timed ibuproprofen for pain tonight. Discussed timed voiding, keeping bladder empty, call if 2 pads per hour are soaked.   Aviva Signs, CNM

## 2022-08-31 MED ORDER — METHYLERGONOVINE MALEATE 0.2 MG PO TABS
0.2000 mg | ORAL_TABLET | Freq: Three times a day (TID) | ORAL | Status: AC
  Administered 2022-08-31 – 2022-09-01 (×3): 0.2 mg via ORAL
  Filled 2022-08-31 (×3): qty 1

## 2022-08-31 NOTE — Lactation Note (Addendum)
This note was copied from a baby's chart. Lactation Consultation Note  Patient Name: Girl Taqwa Deem VEHMC'N Date: 08/31/2022 Reason for consult: Initial assessment Age:29 hours  Swahili interpreter used via video. 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  She breastfed other children for 1-3 years. Provided education regarding her milk supply.  Also discussed pumping when going back to work and requesting pumping breaks at work.  . Feed on demand with cues.  Goal 8-12+ times per day after first 24 hrs.  Place baby STS if not cueing.  Suggest offering breast before formula to help establish her milk supply.  Mom made aware of O/P services, breastfeeding support groups, community resources, and our phone # for post-discharge questions.  Feed on demand with cues.  Goal 8-12+ times per day after first 24 hrs.  Place baby STS if not cueing.  LC sent Stork pump request and [employer] states pump will be delivered shortly.   Maternal Data Has patient been taught Hand Expression?: Yes Does the patient have breastfeeding experience prior to this delivery?: Yes How long did the patient breastfeed?: 6 mos.  Feeding Mother's Current Feeding Choice: Breast Milk and Formula  Lactation Tools Discussed/Used  DEBP  Interventions Interventions: Education;LC Services brochure  Discharge Pump: Stork Pump  Consult Status Consult Status: Follow-up Date: 09/01/22 Follow-up type: In-patient    Dahlia Byes [facility] 08/31/2022, 9:21 AM

## 2022-08-31 NOTE — Progress Notes (Signed)
Patient ID: Jill Huff, female   DOB: Feb 23, 1991, 31 y.o.   MRN: [id number]  Post Partum Day One:S/P SVD  Subjective: Patient up ad lib, denies syncope or dizziness. Reports consuming regular diet without issues and denies N/V. Denies issues with urination and reports bleeding is getting better, but still having some clots.  Patient is breastfeeding and reports going .  Desires condoms for postpartum contraception.  Pain is being managed with use of Motrin, Tylenol, and one dose of Oxycodone.  However the pain is still present at 6/10.   Objective: Vitals:   08/30/22 1435 08/30/22 1540 08/30/22 2300 08/31/22 0300  BP: 125/73 124/64 127/77 111/72  Pulse: 66 70 63 66  Resp: 16 16 18 16   Temp: 97.8 F (36.6 C)  98.5 F (36.9 C) 98 F (36.7 C)  TempSrc: Oral  Oral Oral  SpO2: 100% 100% 100% 100%  Weight:      Height:       Recent Labs    08/30/22 0706  HGB 14.3  HCT 44.3    Physical Exam:  General: alert, cooperative, and no distress Mood/Affect: Appropriate Lungs: clear to auscultation, no wheezes, rales or rhonchi, symmetric air entry.  Heart: normal rate and regular rhythm. Breast: not examined. Abdomen:  + bowel sounds, Soft, NT Uterine Fundus: firm, +2/U Lochia: inappropriate, moderate to heavy. No blood clots noted on pad. No actively brisk bleeding. Laceration: None Skin: Warm, Dry DVT Evaluation: No evidence of DVT seen on physical exam. No significant calf/ankle edema.  Assessment S/P Vaginal Delivery-Day One Breast Feeding Heavy Bleeding Pain  Plan: -Discussed bleeding and patient to be started on Methergine series for 24 hrs.  -Informed of and encouraged to take pain medication. -Will leave IV in for now in case access needed.  -Plan for repeat CBC in the morning.  -Continue current care -Interpretations completed with assistance of Sophia [phone].   [id number], MSN, CNM 08/31/2022, 11:06 AM

## 2022-09-01 ENCOUNTER — Other Ambulatory Visit (HOSPITAL_COMMUNITY): Payer: Self-pay

## 2022-09-01 ENCOUNTER — Encounter (HOSPITAL_COMMUNITY): Payer: Self-pay | Admitting: Obstetrics and Gynecology

## 2022-09-01 LAB — CBC WITH DIFFERENTIAL/PLATELET
Abs Immature Granulocytes: 0.02 10*3/uL (ref 0.00–0.07)
Basophils Absolute: 0 10*3/uL (ref 0.0–0.1)
Basophils Relative: 0 %
Eosinophils Absolute: 0.2 10*3/uL (ref 0.0–0.5)
Eosinophils Relative: 2 %
HCT: 38.1 % (ref 36.0–46.0)
Hemoglobin: 12.3 g/dL (ref 12.0–15.0)
Immature Granulocytes: 0 %
Lymphocytes Relative: 25 %
Lymphs Abs: 1.9 10*3/uL (ref 0.7–4.0)
MCH: 26.1 pg (ref 26.0–34.0)
MCHC: 32.3 g/dL (ref 30.0–36.0)
MCV: 80.9 fL (ref 80.0–100.0)
Monocytes Absolute: 0.4 10*3/uL (ref 0.1–1.0)
Monocytes Relative: 5 %
Neutro Abs: 5.3 10*3/uL (ref 1.7–7.7)
Neutrophils Relative %: 68 %
Platelets: 140 10*3/uL — ABNORMAL LOW (ref 150–400)
RBC: 4.71 MIL/uL (ref 3.87–5.11)
RDW: 15.3 % (ref 11.5–15.5)
WBC: 7.8 10*3/uL (ref 4.0–10.5)
nRBC: 0 % (ref 0.0–0.2)

## 2022-09-01 MED ORDER — IBUPROFEN 600 MG PO TABS
600.0000 mg | ORAL_TABLET | Freq: Four times a day (QID) | ORAL | 0 refills | Status: DC
  Filled 2022-09-01: qty 30, 8d supply, fill #0

## 2022-09-01 MED ORDER — SENNOSIDES-DOCUSATE SODIUM 8.6-50 MG PO TABS
2.0000 | ORAL_TABLET | ORAL | 0 refills | Status: AC
  Filled 2022-09-01: qty 30, 15d supply, fill #0

## 2022-09-01 MED ORDER — ACETAMINOPHEN 325 MG PO TABS
650.0000 mg | ORAL_TABLET | ORAL | 0 refills | Status: AC | PRN
  Filled 2022-09-01: qty 30, 3d supply, fill #0

## 2022-09-01 MED ORDER — BENZOCAINE-MENTHOL 20-0.5 % EX AERO
1.0000 | INHALATION_SPRAY | CUTANEOUS | 0 refills | Status: AC | PRN
  Filled 2022-09-01: qty 78, fill #0

## 2022-09-02 ENCOUNTER — Inpatient Hospital Stay (HOSPITAL_COMMUNITY): Payer: Medicaid Other

## 2022-09-09 ENCOUNTER — Telehealth (HOSPITAL_COMMUNITY): Payer: Self-pay

## 2022-09-09 NOTE — Telephone Encounter (Signed)
Patient reports that she is doing great. Patient declines questions/concerns about her health and healing.  Patient reports that baby is doing fine. Eating, peeing/pooping, and gaining weight well. Baby sleeps in a crib. RN reviewed ABC's of safe sleep with patient. Patient declines any questions or concerns about baby.  EPDS score is 1.  Marcelino Duster [facility] 09/09/22,0919

## 2022-09-25 ENCOUNTER — Encounter: Payer: Self-pay | Admitting: *Deleted

## 2022-09-25 DIAGNOSIS — Z8619 Personal history of other infectious and parasitic diseases: Secondary | ICD-10-CM | POA: Diagnosis not present

## 2022-09-25 DIAGNOSIS — B180 Chronic viral hepatitis B with delta-agent: Secondary | ICD-10-CM | POA: Diagnosis not present

## 2022-11-16 ENCOUNTER — Ambulatory Visit: Payer: Medicaid Other | Admitting: Certified Nurse Midwife

## 2022-11-18 NOTE — Progress Notes (Signed)
Patient did not show for appointment today.   Jill Huff) [name], MSN, [place] for [place]  11/18/22 1:29 PM

## 2022-11-28 DIAGNOSIS — Z113 Encounter for screening for infections with a predominantly sexual mode of transmission: Secondary | ICD-10-CM | POA: Diagnosis not present

## 2022-11-28 DIAGNOSIS — Z30011 Encounter for initial prescription of contraceptive pills: Secondary | ICD-10-CM | POA: Diagnosis not present

## 2022-11-28 DIAGNOSIS — Z3202 Encounter for pregnancy test, result negative: Secondary | ICD-10-CM | POA: Diagnosis not present

## 2022-12-28 DIAGNOSIS — B180 Chronic viral hepatitis B with delta-agent: Secondary | ICD-10-CM | POA: Diagnosis not present

## 2023-03-28 ENCOUNTER — Other Ambulatory Visit: Payer: Self-pay | Admitting: Nurse Practitioner

## 2023-03-28 DIAGNOSIS — B180 Chronic viral hepatitis B with delta-agent: Secondary | ICD-10-CM | POA: Diagnosis not present

## 2023-03-28 DIAGNOSIS — Z8619 Personal history of other infectious and parasitic diseases: Secondary | ICD-10-CM | POA: Diagnosis not present

## 2023-08-21 DIAGNOSIS — Z30013 Encounter for initial prescription of injectable contraceptive: Secondary | ICD-10-CM | POA: Diagnosis not present

## 2023-08-21 DIAGNOSIS — Z3202 Encounter for pregnancy test, result negative: Secondary | ICD-10-CM | POA: Diagnosis not present

## 2023-09-27 ENCOUNTER — Other Ambulatory Visit: Payer: Self-pay | Admitting: Nurse Practitioner

## 2023-09-27 DIAGNOSIS — B180 Chronic viral hepatitis B with delta-agent: Secondary | ICD-10-CM | POA: Diagnosis not present

## 2023-10-04 ENCOUNTER — Ambulatory Visit
Admission: RE | Admit: 2023-10-04 | Discharge: 2023-10-04 | Disposition: A | Discharge status: Home / Self Care | Payer: Medicaid Other | Source: Ambulatory Visit | Attending: Nurse Practitioner | Admitting: Nurse Practitioner

## 2023-10-04 DIAGNOSIS — B1910 Unspecified viral hepatitis B without hepatic coma: Secondary | ICD-10-CM | POA: Diagnosis not present

## 2023-10-04 DIAGNOSIS — B180 Chronic viral hepatitis B with delta-agent: Secondary | ICD-10-CM

## 2024-01-31 ENCOUNTER — Encounter: Payer: Self-pay | Admitting: Family Medicine

## 2024-01-31 ENCOUNTER — Ambulatory Visit (INDEPENDENT_AMBULATORY_CARE_PROVIDER_SITE_OTHER): Admitting: Family Medicine

## 2024-01-31 VITALS — BP 102/70 | HR 65 | Wt 184.0 lb

## 2024-01-31 DIAGNOSIS — Z0000 Encounter for general adult medical examination without abnormal findings: Secondary | ICD-10-CM | POA: Diagnosis not present

## 2024-01-31 DIAGNOSIS — M79604 Pain in right leg: Secondary | ICD-10-CM | POA: Diagnosis not present

## 2024-01-31 DIAGNOSIS — M79605 Pain in left leg: Secondary | ICD-10-CM

## 2024-01-31 MED ORDER — NAPROXEN 500 MG PO TABS: 500.0000 mg | ORAL_TABLET | Freq: Two times a day (BID) | ORAL | 0 refills | Status: DC

## 2024-01-31 NOTE — Patient Instructions (Addendum)
 It was wonderful to see you today!  Today was your annual physical exam. We were unable to do lab work today, but your most recent labs with your liver doctor were good.   For your arm and leg pain, I have ordered an x-ray of your low back to look at the space between the bones and make sure they are not pinching the nerves which can sometimes cause the type of pain you have been having.  While we are waiting for answers I have also prescribed a medicine called naproxen  that can help with your pain.  You should take this medicine 2 times a day with food for the next 2 weeks.  For the headaches you have been having you can use a medicine called Tylenol  or acetaminophen .  This is the same medicine as paracetamol.  You can take this as needed for your headaches which are a type of headache called tension headaches.  For the issue you have been having with pelvic pain I have scheduled you an appointment with my colleague Dr. Sarahann Cumins, to further discuss the problems you have been having and to reevaluate any ongoing problems that could be there.  Please call [phone] with any questions about today's appointment.   If you need any additional refills, please call your pharmacy before calling the office.  Rayma Calandra, DO Family Medicine

## 2024-01-31 NOTE — Progress Notes (Signed)
    SUBJECTIVE:   CHIEF COMPLAINT / HPI:   Here for annual physical. Up to date on care gaps  Reviewed recent Hep B titers and LFTs done with Hepatology at [facility], levels appropriate.  Patient reports that she has had ongoing left arm pain and bilateral leg pain for the last month.  Her left arm pain is unchanged from previous, she has not taken anything to help with this pain it is intermittent and achy.  Nothing seems to bring it on.  The leg pain started about a month ago she first noticed it after sleeping strangely.  She describes it as similar to her arm pain where it is achy and her legs feel heavy.  It does not happen every night but most often happens when she first wakes up.  Patient also reports ongoing pelvic discomfort with intercourse, she was last seen for this issue in 2023 but has had no further workup since then.  Uncertain of exact date of last menstrual period but leaves it occurred at the beginning of this month.  Given that this concern was brought up at the end of the visit, scheduled follow-up visit for next week.  PERTINENT  PMH / PSH: Chronic Hepatitis B with Hepatitis delta, chronic tendiopathy of the left wrist.   OBJECTIVE:   BP 102/70   Pulse 65   Wt 184 lb (83.5 kg)   LMP 01/14/2024   SpO2 98%   BMI 31.58 kg/m   General: A&O, NAD Cardiac: RRR, no m/r/g Respiratory: CTAB, normal WOB, no w/c/r Extremities: NTTP, no peripheral edema.  Bilateral strength intact in both upper and lower extremities at the elbows wrists and shoulders as well as the knees and hips.  Straight leg raise negative bilaterally.  No tenderness over the lumbar spine or tissue texture changes  ASSESSMENT/PLAN:   Assessment & Plan Healthcare maintenance - Follow-up appointment on 5/29 at 3:45 PM to address pelvic pain -Commend urine pregnancy test at this visit to rule out pregnancy Bilateral lower extremity pain - Ordered short course of naproxen 500 mg twice daily for 15 days -DG  lumbar spine which will not be completed until after her visit with Dr. Becki Bouton next week where she will have a urine pregnancy test done.  Patient was called following her appointment today and informed of this she expressed understanding and agreement with this plan.   Jill Calandra, DO [facility]

## 2024-01-31 NOTE — Assessment & Plan Note (Signed)
-   Follow-up appointment on 5/29 at 3:45 PM to address pelvic pain -Commend urine pregnancy test at this visit to rule out pregnancy

## 2024-02-07 ENCOUNTER — Encounter: Payer: Self-pay | Admitting: Family Medicine

## 2024-02-07 ENCOUNTER — Ambulatory Visit: Payer: Self-pay | Admitting: Family Medicine

## 2024-02-07 VITALS — BP 126/83 | HR 70 | Wt 183.8 lb

## 2024-02-07 DIAGNOSIS — R102 Pelvic and perineal pain: Secondary | ICD-10-CM | POA: Diagnosis not present

## 2024-02-07 DIAGNOSIS — N941 Unspecified dyspareunia: Secondary | ICD-10-CM

## 2024-02-07 DIAGNOSIS — M67432 Ganglion, left wrist: Secondary | ICD-10-CM

## 2024-02-07 DIAGNOSIS — R300 Dysuria: Secondary | ICD-10-CM | POA: Diagnosis not present

## 2024-02-07 LAB — POCT URINALYSIS DIP (MANUAL ENTRY)
Bilirubin, UA: NEGATIVE
Glucose, UA: NEGATIVE mg/dL
Ketones, POC UA: NEGATIVE mg/dL
Leukocytes, UA: NEGATIVE
Nitrite, UA: NEGATIVE
Protein Ur, POC: NEGATIVE mg/dL
Spec Grav, UA: <=1.005 — AB (ref 1.010–1.025)
Urobilinogen, UA: 0.2 U/dL
pH, UA: 6 (ref 5.0–8.0)

## 2024-02-07 LAB — POCT WET PREP (WET MOUNT)
Clue Cells Wet Prep Whiff POC: NEGATIVE
Trichomonas Wet Prep HPF POC: ABSENT
WBC, Wet Prep HPF POC: NONE SEEN

## 2024-02-07 LAB — POCT URINE PREGNANCY: Preg Test, Ur: NEGATIVE

## 2024-02-07 NOTE — Patient Instructions (Addendum)
 Ningejaribu mafuta ya [place] wakati wa kujamiiana kama inavyoonyeshwa hapa chini. Sharyle Deer kupata hii [place] duka lolote ikiwa ni pamoja na [employer]. [facility] mwingine ukavu wa uke unaweza kusababisha maumivu na moto wakati wa ngono. Ikiwa hii haisaidii, tafadhali rudi na utuone.   Ikiwa swab yako itarudi isiyo ya [place], nitakupigia simu. Kipimo chako cha ujauzito kilikuwa hasi leo.  I would try a lubricant during intercourse as pictured below. You can get this at any store including [employer]. Sometimes vaginal dryness can cause pain and burning during sex. If this does not help, please come back and see us .   If your swab comes back abnormal, I will call you. Your pregnancy test was negative today.

## 2024-02-07 NOTE — Assessment & Plan Note (Signed)
 Cyst is bothersome and intermittently painful. Has not grown in size Hand surgery referral sent

## 2024-02-07 NOTE — Progress Notes (Signed)
    SUBJECTIVE:   CHIEF COMPLAINT / HPI:   Burning with intercourse x 2 years and sometimes burning with urination afterwards No vaginal bleeding, vaginal discharge, pelvic pain, vaginal itching. Had a baby in 08/2022, no longer breast-feeding Feels like she does have vaginal dryness as well Seen for the symptoms in 2023, had a yeast infection at that time and was treated but the burning with intercourse did not improve  Large ganglion cyst to left wrist, intermittently painful and bothersome to patient.  MRI 2020 with multiloculated ganglion cyst  PERTINENT  PMH / PSH: chronic viral hep B  OBJECTIVE:   BP 126/83   Pulse 70   Wt 183 lb 12.8 oz (83.4 kg)   LMP 01/14/2024   SpO2 99%   BMI 31.55 kg/m   Pelvic - CMA Dayshia present. VULVA: normal appearing vulva with no masses, tenderness or lesions  VAGINA: normal appearing vagina with normal color and discharge, no lesions  CERVIX: normal appearing cervix without discharge or lesions, no CMT  UTERUS: uterus is felt to be normal size, shape, consistency and nontender   ADNEXA: No adnexal masses or tenderness noted. Extremities: Large ganglion cyst present to L wrist. Full flexion and extension of L wrist without difficulty. No TTP.   ASSESSMENT/PLAN:   Assessment & Plan Dyspareunia, female No pain during pelvic exam.  Reassuringly no pelvic pain, burning throughout all of intercourse.  Suspect vaginal dryness to be playing a role in this. Wet prep negative.  Urine dipstick negative.  Pregnancy test negative. Advised patient to use lubricant during intercourse.  If this does not improve her symptoms please return and could consider estrogen cream Ganglion cyst of dorsum of left wrist Cyst is bothersome and intermittently painful. Has not grown in size Hand surgery referral sent     Jill Cumins, DO [facility]

## 2024-02-08 ENCOUNTER — Telehealth: Payer: Self-pay | Admitting: Family Medicine

## 2024-02-08 LAB — URINALYSIS, MICROSCOPIC ONLY
Bacteria, UA: NONE SEEN
Casts: NONE SEEN /LPF
RBC, Urine: NONE SEEN /HPF (ref 0–2)

## 2024-02-08 NOTE — Telephone Encounter (Signed)
 Called pt with Swahili interpreter to let her know of negative test results. Questions answered.

## 2024-05-17 ENCOUNTER — Other Ambulatory Visit: Payer: Self-pay

## 2024-05-17 ENCOUNTER — Inpatient Hospital Stay (HOSPITAL_COMMUNITY)
Admission: AD | Admit: 2024-05-17 | Discharge: 2024-05-17 | Disposition: A | Discharge status: Home / Self Care | Attending: Obstetrics & Gynecology | Admitting: Obstetrics & Gynecology

## 2024-05-17 ENCOUNTER — Encounter (HOSPITAL_COMMUNITY): Payer: Self-pay | Admitting: Obstetrics & Gynecology

## 2024-05-17 DIAGNOSIS — R110 Nausea: Secondary | ICD-10-CM | POA: Diagnosis not present

## 2024-05-17 DIAGNOSIS — O26891 Other specified pregnancy related conditions, first trimester: Secondary | ICD-10-CM | POA: Insufficient documentation

## 2024-05-17 DIAGNOSIS — R42 Dizziness and giddiness: Secondary | ICD-10-CM | POA: Diagnosis present

## 2024-05-17 DIAGNOSIS — Z3A08 8 weeks gestation of pregnancy: Secondary | ICD-10-CM

## 2024-05-17 DIAGNOSIS — Z711 Person with feared health complaint in whom no diagnosis is made: Secondary | ICD-10-CM

## 2024-05-17 DIAGNOSIS — H538 Other visual disturbances: Secondary | ICD-10-CM | POA: Diagnosis present

## 2024-05-17 DIAGNOSIS — O26812 Pregnancy related exhaustion and fatigue, second trimester: Secondary | ICD-10-CM

## 2024-05-17 DIAGNOSIS — R519 Headache, unspecified: Secondary | ICD-10-CM | POA: Diagnosis present

## 2024-05-17 LAB — URINALYSIS, ROUTINE W REFLEX MICROSCOPIC
Bilirubin Urine: NEGATIVE
Glucose, UA: NEGATIVE mg/dL
Hgb urine dipstick: NEGATIVE
Ketones, ur: NEGATIVE mg/dL
Leukocytes,Ua: NEGATIVE
Nitrite: NEGATIVE
Protein, ur: 30 mg/dL — AB
Specific Gravity, Urine: 1.025 (ref 1.005–1.030)
pH: 6 (ref 5.0–8.0)

## 2024-05-17 LAB — CBC
HCT: 39.6 % (ref 36.0–46.0)
Hemoglobin: 12.4 g/dL (ref 12.0–15.0)
MCH: 24.1 pg — ABNORMAL LOW (ref 26.0–34.0)
MCHC: 31.3 g/dL (ref 30.0–36.0)
MCV: 76.9 fL — ABNORMAL LOW (ref 80.0–100.0)
Platelets: 235 K/uL (ref 150–400)
RBC: 5.15 MIL/uL — ABNORMAL HIGH (ref 3.87–5.11)
RDW: 15.7 % — ABNORMAL HIGH (ref 11.5–15.5)
WBC: 8.1 K/uL (ref 4.0–10.5)
nRBC: 0 % (ref 0.0–0.2)

## 2024-05-17 LAB — POCT PREGNANCY, URINE: Preg Test, Ur: POSITIVE — AB

## 2024-05-17 MED ORDER — SCOPOLAMINE 1 MG/3DAYS TD PT72
1.0000 | MEDICATED_PATCH | TRANSDERMAL | Status: DC
  Administered 2024-05-17: 1 mg via TRANSDERMAL
  Filled 2024-05-17: qty 1

## 2024-05-17 MED ORDER — SCOPOLAMINE 1 MG/3DAYS TD PT72: 1.0000 | MEDICATED_PATCH | TRANSDERMAL | 12 refills | Status: AC

## 2024-05-17 NOTE — MAU Provider Note (Signed)
 Chief Complaint:  Possible Pregnancy, Fatigue, and no appetite   HPI   Event Date/Time   First Provider Initiated Contact with Patient 05/17/24 1200   Jill Huff is a 33 y.o. G5P4004 at [redacted]w[redacted]d by approximate LMP, who presents to maternity admissions reporting dizziness, blurry vision, headache, and feeling weak intermittently for past 2 weeks. Last episode of blurry vision and headache was 2 days ago. Currently feeling dizzy and weak today. These symptoms improve with eating and drinking, but do not fully resolve. Describes decreased appetite and nausea making it difficult to eat and drink regularly. Has not had a similar experience with any of her 4 prior pregnancies.  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   Subchorionic hematoma in first trimester 04/12/2020   Threatened miscarriage 04/12/2020   OB History  Gravida Para Term Preterm AB Living  5 4 4  0 0 4  SAB IAB Ectopic Multiple Live Births  0 0 0 0 2    # Outcome Date GA Lbr Len/2nd Weight Sex Type Anes PTL Lv  5 Current           4 Term 08/30/22 [redacted]w[redacted]d 12:39 / 00:13 3700 g F Vag-Spont None  LIV     Birth Comments: wnl  3 Term 11/30/20 [redacted]w[redacted]d 11:03 / 00:03 3657 g M Vag-Spont None  LIV  2 Term 2019 [redacted]w[redacted]d    Vag-Spont     1 Term 07/07/16     Vag-Spont      Past Surgical History:  Procedure Laterality Date   NO PAST SURGERIES     Family History  Problem Relation Age of Onset   Diabetes Neg Hx    Hypertension Neg Hx    Social History   Tobacco Use   Smoking status: Never    Passive exposure: Never   Smokeless tobacco: Never  Vaping Use   Vaping status: Never Used  Substance Use Topics   Alcohol use: Yes     Comment: 1 bottle of wine per week   Drug use: No   No Known Allergies Medications Prior to Admission  Medication Sig Dispense Refill Last Dose/Taking   acetaminophen  (TYLENOL ) 325 MG tablet Take 2 tablets (650 mg total) by mouth every 4 (four) hours as needed (for pain scale < 4). 30 tablet 0    benzocaine -Menthol  (DERMOPLAST) 20-0.5 % AERO Apply 1 Application topically as needed for irritation (perineal discomfort). 78 g 0    naproxen  (NAPROSYN ) 500 MG tablet Take 1 tablet (500 mg total) by mouth 2 (two) times daily with a meal. 30 tablet 0    Prenatal Vit-Fe Fumarate-FA (WESTAB PLUS) 27-1 MG TABS Take 1 tablet by mouth daily. 30 tablet 3    senna-docusate (SENOKOT-S) 8.6-50 MG tablet Take 2 tablets by mouth daily. 30 tablet 0    I have reviewed patient's Past Medical Hx, Surgical Hx, Family Hx, Social Hx, medications and allergies.   ROS  Pertinent items noted in HPI and remainder of comprehensive ROS otherwise negative.   PHYSICAL EXAM  Patient Vitals for the past 24 hrs:  BP Temp Temp src Pulse Resp SpO2  05/17/24 1148 107/69 98 F (36.7 C) Oral 71 15 100 %  Constitutional: Well-developed, well-nourished female in no acute distress Cardiovascular: normal rate & rhythm, warm and well-perfused Respiratory: normal effort, no problems with respiration noted GI: Abd soft, non-distended MS: Normal ROM Neurologic: Alert and oriented x 4 Pelvic: deferred  Labs: Results for orders placed or performed during the hospital encounter of 05/17/24 (from the past 24 hours)  Pregnancy, urine POC     Status: Abnormal   Collection Time: 05/17/24 11:28 AM  Result Value Ref Range   Preg Test, Ur POSITIVE (A) NEGATIVE  Urinalysis, Routine w reflex microscopic -Urine, Clean Catch     Status: Abnormal   Collection Time: 05/17/24 12:21 PM  Result Value Ref Range   Color, Urine AMBER (A) YELLOW   APPearance CLEAR CLEAR   Specific Gravity, Urine 1.025 1.005 - 1.030   pH 6.0 5.0 - 8.0    Glucose, UA NEGATIVE NEGATIVE mg/dL   Hgb urine dipstick NEGATIVE NEGATIVE   Bilirubin Urine NEGATIVE NEGATIVE   Ketones, ur NEGATIVE NEGATIVE mg/dL   Protein, ur 30 (A) NEGATIVE mg/dL   Nitrite NEGATIVE NEGATIVE   Leukocytes,Ua NEGATIVE NEGATIVE   RBC / HPF 0-5 0 - 5 RBC/hpf   WBC, UA 0-5 0 - 5 WBC/hpf   Bacteria, UA RARE (A) NONE SEEN   Squamous Epithelial / HPF 0-5 0 - 5 /HPF   Mucus PRESENT   CBC     Status: Abnormal   Collection Time: 05/17/24 12:28 PM  Result Value Ref Range   WBC 8.1 4.0 - 10.5 K/uL   RBC 5.15 (H) 3.87 - 5.11 MIL/uL   Hemoglobin 12.4 12.0 - 15.0 g/dL   HCT 60.3 63.9 - 53.9 %   MCV 76.9 (L) 80.0 - 100.0 fL   MCH 24.1 (L) 26.0 - 34.0 pg   MCHC 31.3 30.0 - 36.0 g/dL   RDW 84.2 (H) 88.4 - 84.4 %   Platelets 235 150 - 400 K/uL   nRBC 0.0 0.0 - 0.2 %   Imaging:  No results found.  MDM & MAU COURSE  MDM: moderate  MAU Course: Orders Placed This Encounter  Procedures   CBC   Urinalysis, Routine w reflex microscopic -Urine, Clean Catch   Pregnancy, urine POC   Discharge patient Discharge disposition: 01-Home or Self Care; Discharge patient date: 05/17/2024   Meds ordered this encounter  Medications   scopolamine  (TRANSDERM-SCOP) 1 MG/3DAYS 1 mg   scopolamine  (TRANSDERM-SCOP) 1 MG/3DAYS    Sig: Place 1 patch (1 mg total) onto the skin every 3 (three) days.    Dispense:  10 patch    Refill:  12    Supervising Provider:   FREDIRICK GLENYS RAMAN [[id number]]   VS stable, well appearing on physical exam, low suspicion for dehydration or anemia but will collect CBC and UA to rule out Scopolamine  patch applied, order sent to pharmacy for ongoing use CBC shows Hgb 12.4, MCV 76.9, no history of anemia, patient to follow up with prenatal provider for possible iron deficiency UA does not show dehydration Results reviewed with patient, encouraged eating small amount q 2-3 hours and to hydrate well, she has no further questions or concerns Given list of area prenatal providers  or may return to [facility]  Discharge instructions reviewed with Language Line interpreter ID [phone].  ASSESSMENT   1. Pregnancy related fatigue in second trimester   2. Nausea   3. Physically well but worried   4. [redacted] weeks gestation of pregnancy    PLAN  Discharge home in  stable condition with return precautions.     Allergies as of 05/17/2024   No Known Allergies      Medication List     TAKE these medications    acetaminophen  325 MG tablet Commonly known as: Tylenol  Take 2 tablets (650 mg total) by mouth every 4 (four) hours as needed (for pain scale < 4).   benzocaine -Menthol  20-0.5 % Aero Commonly known as: DERMOPLAST Apply 1 Application topically as needed for irritation (perineal discomfort).   naproxen  500 MG tablet Commonly known as: Naprosyn  Take 1 tablet (500 mg total) by mouth 2 (two) times daily with a meal.   scopolamine  1 MG/3DAYS Commonly known as: TRANSDERM-SCOP Place 1 patch (1 mg total) onto the skin every 3 (three) days. Start taking on: May 20, 2024   Senexon-S 8.6-50 MG tablet Generic drug: senna-docusate Take 2 tablets by mouth daily.   WesTab Plus 27-1 MG Tabs Take 1 tablet by mouth daily.      Vernell Ruddle, SNM 05/17/2024 2:03 PM

## 2024-05-17 NOTE — MAU Note (Signed)
 Jill Huff is a 33 y.o. at Unknown here in MAU reporting: Interpreter [phone] used  Reports +HPT August 29th, States she feels dizzy and weak. When she stands for long periods of time her vision goes dark. States she has not completely passed out but feels like she gets close. Denies any vomiting or diarrhea but has been nauseated and has increased saliva. Denies any pain or VB. Feeling hungry but not able to eat. Able to eat and drink but it is limited. Symptoms started 2 weeks ago.    LMP: 03/21/24, unsure  Onset of complaint: 2 weeks ago Pain score: 0 Vitals:   05/17/24 1148  BP: 107/69  Pulse: 71  Resp: 15  Temp: 98 F (36.7 C)  SpO2: 100%      Lab orders placed from triage:  urine pregnancy test

## 2024-05-17 NOTE — Discharge Instructions (Signed)

## 2024-05-31 ENCOUNTER — Encounter (HOSPITAL_COMMUNITY): Payer: Self-pay | Admitting: Obstetrics and Gynecology

## 2024-05-31 ENCOUNTER — Inpatient Hospital Stay (HOSPITAL_COMMUNITY)

## 2024-05-31 ENCOUNTER — Inpatient Hospital Stay (HOSPITAL_COMMUNITY)
Admission: AD | Admit: 2024-05-31 | Discharge: 2024-05-31 | Disposition: A | Discharge status: Home / Self Care | Attending: Obstetrics and Gynecology | Admitting: Obstetrics and Gynecology

## 2024-05-31 DIAGNOSIS — O23592 Infection of other part of genital tract in pregnancy, second trimester: Secondary | ICD-10-CM | POA: Insufficient documentation

## 2024-05-31 DIAGNOSIS — O208 Other hemorrhage in early pregnancy: Secondary | ICD-10-CM | POA: Insufficient documentation

## 2024-05-31 DIAGNOSIS — O23591 Infection of other part of genital tract in pregnancy, first trimester: Secondary | ICD-10-CM

## 2024-05-31 DIAGNOSIS — Z3A10 10 weeks gestation of pregnancy: Secondary | ICD-10-CM

## 2024-05-31 DIAGNOSIS — R109 Unspecified abdominal pain: Secondary | ICD-10-CM | POA: Diagnosis present

## 2024-05-31 DIAGNOSIS — B9689 Other specified bacterial agents as the cause of diseases classified elsewhere: Secondary | ICD-10-CM | POA: Diagnosis not present

## 2024-05-31 DIAGNOSIS — Z603 Acculturation difficulty: Secondary | ICD-10-CM | POA: Diagnosis not present

## 2024-05-31 DIAGNOSIS — Z3A09 9 weeks gestation of pregnancy: Secondary | ICD-10-CM | POA: Diagnosis not present

## 2024-05-31 LAB — CBC
HCT: 40.5 % (ref 36.0–46.0)
Hemoglobin: 12.7 g/dL (ref 12.0–15.0)
MCH: 24 pg — ABNORMAL LOW (ref 26.0–34.0)
MCHC: 31.4 g/dL (ref 30.0–36.0)
MCV: 76.4 fL — ABNORMAL LOW (ref 80.0–100.0)
Platelets: 249 K/uL (ref 150–400)
RBC: 5.3 MIL/uL — ABNORMAL HIGH (ref 3.87–5.11)
RDW: 15.8 % — ABNORMAL HIGH (ref 11.5–15.5)
WBC: 9.5 K/uL (ref 4.0–10.5)
nRBC: 0 % (ref 0.0–0.2)

## 2024-05-31 LAB — COMPREHENSIVE METABOLIC PANEL WITH GFR
ALT: 15 U/L (ref 0–44)
AST: 20 U/L (ref 15–41)
Albumin: 3.7 g/dL (ref 3.5–5.0)
Alkaline Phosphatase: 49 U/L (ref 38–126)
Anion gap: 8 (ref 5–15)
BUN: 6 mg/dL (ref 6–20)
CO2: 22 mmol/L (ref 22–32)
Calcium: 9 mg/dL (ref 8.9–10.3)
Chloride: 106 mmol/L (ref 98–111)
Creatinine, Ser: 0.67 mg/dL (ref 0.44–1.00)
GFR, Estimated: >60 mL/min (ref >60)
Glucose, Bld: 78 mg/dL (ref 70–99)
Potassium: 3.8 mmol/L (ref 3.5–5.1)
Sodium: 136 mmol/L (ref 135–145)
Total Bilirubin: 0.8 mg/dL (ref 0.0–1.2)
Total Protein: 7.1 g/dL (ref 6.5–8.1)

## 2024-05-31 LAB — URINALYSIS, ROUTINE W REFLEX MICROSCOPIC
Bilirubin Urine: NEGATIVE
Glucose, UA: NEGATIVE mg/dL
Hgb urine dipstick: NEGATIVE
Ketones, ur: NEGATIVE mg/dL
Leukocytes,Ua: NEGATIVE
Nitrite: NEGATIVE
Protein, ur: NEGATIVE mg/dL
Specific Gravity, Urine: 1.015 (ref 1.005–1.030)
pH: 7 (ref 5.0–8.0)

## 2024-05-31 LAB — WET PREP, GENITAL
Sperm: NONE SEEN
Trich, Wet Prep: NONE SEEN
WBC, Wet Prep HPF POC: >=10 — AB (ref <10)
Yeast Wet Prep HPF POC: NONE SEEN

## 2024-05-31 MED ORDER — METRONIDAZOLE 500 MG PO TABS: 500.0000 mg | ORAL_TABLET | Freq: Two times a day (BID) | ORAL | 0 refills | Status: AC

## 2024-05-31 NOTE — MAU Note (Signed)
 Jill Huff is a 33 y.o. at [redacted]w[redacted]d here in MAU reporting: pain in lower abd, started 2 days ago. Cramping and tightness, comes and goes. Novag bleeding or d/c. Denies n/v, d or c. No problems with urination.  Pain is getting worse. Has not taken anything for it Onset of complaint: 2 days ago Pain score: 7, feels very bad when the pain comes Vitals:   05/31/24 1020  BP: 112/69  Pulse: 77  Resp: 16  Temp: 99 F (37.2 C)  SpO2: 100%     FHT:172 Lab orders placed from triage:  urine

## 2024-05-31 NOTE — Discharge Instructions (Signed)
 Tylenol  500 mg every 4 hours for pain Metronidazole  twice daily for 7 days to treat bacterial vaginosis  [facility] [employer] for [employer] at [employer] [facility]             [address], [place], [place] [address] [phone]  Center for [facility] at [facility]                                                             [address], [address], [place], [place], [address] [phone]  Center for [facility] at [facility] [address], [place], [place], [address] [phone]  Center for [facility] [address], [address], [place], [place], [address] [phone]  Center for [facility] at [facility]                                 [address] [place], [place], [place], [address] [phone]  Center for [facility] at [facility]                                    [address] [place], [place], [address] [phone]  Center for [facility] at [facility] [address], [address], [place], [place], [address]                              [facility] [address], [address], [place], [place], [address] [phone]  [facility]         Phone: [phone]  [facility]      Phone: [phone]   [facility]      Phone: [phone]  [facility]         Phone: [phone]   [facility]-Maternity    Phone: [phone]  [facility]      Phone: [phone]  [facility]     Phone: [phone]  [facility]        Phone: [phone]  [facility] ([address] [place]) [phone]  [facility]      Phone: [phone]

## 2024-05-31 NOTE — MAU Provider Note (Signed)
 Chief Complaint:  Abdominal Pain   HPI   Event Date/Time   First Provider Initiated Contact with Patient 05/31/24 1046      Jill Huff is a 33 y.o. [id number] at [redacted]w[redacted]d who presents to maternity admissions reporting lower abdominal pain. She reports intermittent lower abdominal cramping that started 2 days ago and has been worsening. She has not identified any triggers, no identified alleviating or aggravating factors. She has not tried anything at home. Denies vaginal bleeding, vaginal discharge, nausea, vomiting, diarrhea, constipation. Last bowel movement was this morning, no blood in stool, stool was not loose or hard.   Due to language barrier, a virtual interpreter was present during the history-taking, physical exam, and subsequent discussion with this patient.    Pregnancy Course: Has not established prenatal care. Seen in MAU for fatigue and nausea on 9/6.  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   Subchorionic hematoma in first trimester 04/12/2020   Threatened miscarriage 04/12/2020   OB History  Gravida Para Term Preterm AB Living  5 4 4  0 0 4  SAB IAB Ectopic Multiple Live Births  0 0 0 0 2    # Outcome Date GA Lbr Len/2nd Weight Sex Type Anes PTL Lv  5 Current           4 Term 08/30/22 [redacted]w[redacted]d 12:39 / 00:13 3700 g F Vag-Spont None  LIV     Birth Comments: wnl  3 Term 11/30/20 [redacted]w[redacted]d 11:03 / 00:03 3657 g M Vag-Spont None  LIV  2 Term 2019 [redacted]w[redacted]d    Vag-Spont     1 Term 07/07/16     Vag-Spont      Past Surgical History:  Procedure Laterality Date   NO PAST SURGERIES     Family History  Problem Relation Age of Onset   Healthy Mother    Healthy  Father    Diabetes Neg Hx    Hypertension Neg Hx    Social History   Tobacco Use   Smoking status: Never    Passive exposure: Never   Smokeless tobacco: Never  Vaping Use   Vaping status: Never Used  Substance Use Topics   Alcohol use: Yes    Comment: 1 bottle of wine per week   Drug use: No   No Known Allergies Medications Prior to Admission  Medication Sig Dispense Refill Last Dose/Taking   scopolamine  (TRANSDERM-SCOP) 1 MG/3DAYS Place 1 patch (1 mg total) onto the skin every 3 (three) days. 10 patch 12 Past Month   tenofovir  (VIREAD ) 300 MG tablet Take 300 mg by mouth daily.   05/30/2024 Morning   acetaminophen  (TYLENOL ) 325 MG tablet Take 2 tablets (650 mg total) by mouth every 4 (four) hours as needed (for pain scale < 4). 30 tablet 0    benzocaine -Menthol  (DERMOPLAST) 20-0.5 % AERO Apply 1 Application topically as needed for irritation (perineal discomfort). 78 g 0    naproxen  (NAPROSYN ) 500 MG tablet Take 1 tablet (500 mg total) by mouth 2 (two) times daily with a meal. 30 tablet 0    Prenatal Vit-Fe Fumarate-FA (WESTAB PLUS) 27-1 MG TABS Take 1 tablet by mouth daily. 30 tablet 3  senna-docusate (SENOKOT-S) 8.6-50 MG tablet Take 2 tablets by mouth daily. 30 tablet 0     I have reviewed patient's Past Medical Hx, Surgical Hx, Family Hx, Social Hx, medications and allergies.   ROS  Pertinent items noted in HPI and remainder of comprehensive ROS otherwise negative.   PHYSICAL EXAM  Patient Vitals for the past 24 hrs:  BP Temp Temp src Pulse Resp SpO2 Height Weight  05/31/24 1020 112/69 99 F (37.2 C) Oral 77 16 100 % 5' 6 (1.676 m) 80.2 kg    Constitutional: Well-developed, well-nourished female in no acute distress.  HEENT: atraumatic, normocephalic. Neck has normal ROM. EOM intact. Cardiovascular: normal rate & rhythm, warm and well-perfused Respiratory: normal effort, no problems with respiration noted GI: Abd soft, non-tender, non-distended MSK: Extremities  nontender, no edema, normal ROM Skin: warm and dry. Acyanotic, no jaundice or pallor. Neurologic: Alert and oriented x 4. No abnormal coordination. Psychiatric: Normal mood. Speech not slurred, not rapid/pressured. Patient is cooperative. GU: no CVA tenderness Pelvic exam: deferred  Labs: Results for orders placed or performed during the hospital encounter of 05/31/24 (from the past 24 hours)  Wet prep, genital     Status: Abnormal   Collection Time: 05/31/24 10:52 AM   Specimen: Urine, Clean Catch  Result Value Ref Range   Yeast Wet Prep HPF POC NONE SEEN NONE SEEN   Trich, Wet Prep NONE SEEN NONE SEEN   Clue Cells Wet Prep HPF POC PRESENT (A) NONE SEEN   WBC, Wet Prep HPF POC >=10 (A) <10   Sperm NONE SEEN   Urinalysis, Routine w reflex microscopic -Urine, Clean Catch     Status: None   Collection Time: 05/31/24 10:52 AM  Result Value Ref Range   Color, Urine YELLOW YELLOW   APPearance CLEAR CLEAR   Specific Gravity, Urine 1.015 1.005 - 1.030   pH 7.0 5.0 - 8.0   Glucose, UA NEGATIVE NEGATIVE mg/dL   Hgb urine dipstick NEGATIVE NEGATIVE   Bilirubin Urine NEGATIVE NEGATIVE   Ketones, ur NEGATIVE NEGATIVE mg/dL   Protein, ur NEGATIVE NEGATIVE mg/dL   Nitrite NEGATIVE NEGATIVE   Leukocytes,Ua NEGATIVE NEGATIVE  CBC     Status: Abnormal   Collection Time: 05/31/24 11:05 AM  Result Value Ref Range   WBC 9.5 4.0 - 10.5 K/uL   RBC 5.30 (H) 3.87 - 5.11 MIL/uL   Hemoglobin 12.7 12.0 - 15.0 g/dL   HCT 59.4 63.9 - 53.9 %   MCV 76.4 (L) 80.0 - 100.0 fL   MCH 24.0 (L) 26.0 - 34.0 pg   MCHC 31.4 30.0 - 36.0 g/dL   RDW 84.1 (H) 88.4 - 84.4 %   Platelets 249 150 - 400 K/uL   nRBC 0.0 0.0 - 0.2 %  Comprehensive metabolic panel     Status: None   Collection Time: 05/31/24 11:05 AM  Result Value Ref Range   Sodium 136 135 - 145 mmol/L   Potassium 3.8 3.5 - 5.1 mmol/L   Chloride 106 98 - 111 mmol/L   CO2 22 22 - 32 mmol/L   Glucose, Bld 78 70 - 99 mg/dL   BUN 6 6 - 20 mg/dL    Creatinine, Ser 9.32 0.44 - 1.00 mg/dL   Calcium 9.0 8.9 - 89.6 mg/dL   Total Protein 7.1 6.5 - 8.1 g/dL   Albumin 3.7 3.5 - 5.0 g/dL   AST 20 15 - 41 U/L   ALT 15 0 - 44 U/L   Alkaline Phosphatase  49 38 - 126 U/L   Total Bilirubin 0.8 0.0 - 1.2 mg/dL   GFR, Estimated >39 >39 mL/min   Anion gap 8 5 - 15    Imaging:  US  OB Comp Less 14 Wks Result Date: 05/31/2024 CLINICAL DATA:  [id number] Abdominal pain during pregnancy in first trimester [id number]. EXAM: OBSTETRIC <14 WK ULTRASOUND TECHNIQUE: Transabdominal ultrasound was performed for evaluation of the gestation as well as the maternal uterus and adnexal regions. COMPARISON:  None Available. FINDINGS: Intrauterine gestational sac: Single Yolk sac:  Visualized. Embryo:  Visualized. Cardiac Activity: Visualized. Heart Rate: 173 bpm CRL:   24.1 mm   9 w 1 d                  US  EDC: 01/02/2025. Subchorionic hemorrhage: There is small amount of subchorionic hemorrhage measuring 1.1 x 2.3 x 2.5 cm, occupying less than 25% of the circumference of the gestational sac. Maternal uterus/adnexae: Bilateral ovaries are within normal limits. IMPRESSION: 1. Single live intrauterine gestation with crown-rump length corresponding to 9 weeks 1 day. 2. There is small subchorionic hemorrhage occupying less than 25% of the circumference of the gestational sac. Electronically Signed   By: Ree Molt M.D.   On: 05/31/2024 12:31    MDM & MAU COURSE  MDM: High  MAU Course: -Vital signs within normal limits. -UA and wet prep for infection. -CBC and CMP to rule out GI causes of abdominal pain. -US  due to abdominal pain in pregnancy to rule out ectopic pregnancy and SAB, no prior imaging for comparison.  -UA negative for infection. Wet prep positive for BV, will treat with metronidazole . -CBC and CMP without significant abnormalities. -US  shows IUP with appropriate cardiac activity. No ectopic pregnancy or indications of SAB. Subchorionic hemorrhage present. -Liver  function within normal limits, can use Tylenol  for pain.  Differential diagnosis considered for lower abdominal pain includes but is not limited to: round ligament pain, ectopic pregnancy, SAB, UTI, pyelonephritis, PID, cervicitis, appendicitis, diverticulitis, constipation, nephrolithiasis   Orders Placed This Encounter  Procedures   Wet prep, genital   US  OB Comp Less 14 Wks   Urinalysis, Routine w reflex microscopic -Urine, Clean Catch   CBC   RPR   Comprehensive metabolic panel   Discharge patient   Meds ordered this encounter  Medications   metroNIDAZOLE  (FLAGYL ) 500 MG tablet    Sig: Take 1 tablet (500 mg total) by mouth 2 (two) times daily for 7 days.    Dispense:  14 tablet    Refill:  0    ASSESSMENT   1. Bacterial vaginosis in pregnancy   2. [redacted] weeks gestation of pregnancy     PLAN  Discharge home in stable condition with return precautions.  Pelvic rest for subchorionic hematoma. Metronidazole  500 mg BID x7 days for BV. Tylenol  500 mg every 4 hours prn for pain. List of OBGYN providers given in AVS, encouraged to establish prenatal care.     Allergies as of 05/31/2024   No Known Allergies      Medication List     STOP taking these medications    naproxen  500 MG tablet Commonly known as: Naprosyn        TAKE these medications    acetaminophen  325 MG tablet Commonly known as: Tylenol  Take 2 tablets (650 mg total) by mouth every 4 (four) hours as needed (for pain scale < 4).   benzocaine -Menthol  20-0.5 % Aero Commonly known as: DERMOPLAST Apply 1 Application topically as needed for  irritation (perineal discomfort).   metroNIDAZOLE  500 MG tablet Commonly known as: FLAGYL  Take 1 tablet (500 mg total) by mouth 2 (two) times daily for 7 days.   scopolamine  1 MG/3DAYS Commonly known as: TRANSDERM-SCOP Place 1 patch (1 mg total) onto the skin every 3 (three) days.   Senexon-S 8.6-50 MG tablet Generic drug: senna-docusate Take 2 tablets by mouth  daily.   tenofovir  300 MG tablet Commonly known as: VIREAD  Take 300 mg by mouth daily.   WesTab Plus 27-1 MG Tabs Take 1 tablet by mouth daily.        Joesph DELENA Sear, PA

## 2024-05-31 NOTE — MAU Note (Signed)
Lab here for blood draw

## 2024-06-01 LAB — RPR: RPR Ser Ql: NONREACTIVE

## 2024-06-30 ENCOUNTER — Other Ambulatory Visit: Payer: Self-pay | Admitting: Obstetrics & Gynecology

## 2024-06-30 DIAGNOSIS — Z36 Encounter for antenatal screening for chromosomal anomalies: Secondary | ICD-10-CM

## 2024-06-30 DIAGNOSIS — Z3482 Encounter for supervision of other normal pregnancy, second trimester: Secondary | ICD-10-CM | POA: Diagnosis not present

## 2024-06-30 DIAGNOSIS — Z3481 Encounter for supervision of other normal pregnancy, first trimester: Secondary | ICD-10-CM | POA: Diagnosis not present

## 2024-07-14 DIAGNOSIS — Z3482 Encounter for supervision of other normal pregnancy, second trimester: Secondary | ICD-10-CM | POA: Diagnosis not present

## 2024-08-04 DIAGNOSIS — Z148 Genetic carrier of other disease: Secondary | ICD-10-CM | POA: Insufficient documentation

## 2024-08-04 DIAGNOSIS — D563 Thalassemia minor: Secondary | ICD-10-CM | POA: Insufficient documentation

## 2024-08-11 DIAGNOSIS — B3731 Acute candidiasis of vulva and vagina: Secondary | ICD-10-CM | POA: Diagnosis not present

## 2024-08-11 DIAGNOSIS — Z789 Other specified health status: Secondary | ICD-10-CM | POA: Diagnosis not present

## 2024-08-11 DIAGNOSIS — Z3482 Encounter for supervision of other normal pregnancy, second trimester: Secondary | ICD-10-CM | POA: Diagnosis not present

## 2024-08-18 ENCOUNTER — Ambulatory Visit: Attending: Family Medicine

## 2024-08-18 ENCOUNTER — Other Ambulatory Visit

## 2024-08-18 DIAGNOSIS — D563 Thalassemia minor: Secondary | ICD-10-CM

## 2024-08-18 DIAGNOSIS — Z148 Genetic carrier of other disease: Secondary | ICD-10-CM

## 2024-10-10 ENCOUNTER — Ambulatory Visit: Attending: Obstetrics and Gynecology | Admitting: Obstetrics and Gynecology

## 2024-10-10 ENCOUNTER — Ambulatory Visit (HOSPITAL_BASED_OUTPATIENT_CLINIC_OR_DEPARTMENT_OTHER)

## 2024-10-10 VITALS — BP 125/69 | HR 80

## 2024-10-10 DIAGNOSIS — O98512 Other viral diseases complicating pregnancy, second trimester: Secondary | ICD-10-CM | POA: Insufficient documentation

## 2024-10-10 DIAGNOSIS — Z36 Encounter for antenatal screening for chromosomal anomalies: Secondary | ICD-10-CM | POA: Diagnosis not present

## 2024-10-10 DIAGNOSIS — Z3A28 28 weeks gestation of pregnancy: Secondary | ICD-10-CM | POA: Insufficient documentation

## 2024-10-10 DIAGNOSIS — Z364 Encounter for antenatal screening for fetal growth retardation: Secondary | ICD-10-CM | POA: Insufficient documentation

## 2024-10-10 DIAGNOSIS — B181 Chronic viral hepatitis B without delta-agent: Secondary | ICD-10-CM | POA: Insufficient documentation

## 2024-10-10 DIAGNOSIS — O321XX0 Maternal care for breech presentation, not applicable or unspecified: Secondary | ICD-10-CM | POA: Diagnosis not present

## 2024-10-10 DIAGNOSIS — B180 Chronic viral hepatitis B with delta-agent: Secondary | ICD-10-CM

## 2024-10-10 NOTE — Progress Notes (Signed)
 After review, MFM consult with provider is not indicated for today  Arna Ranks, MD 10/10/2024 3:58 PM  [facility]

## 2024-11-21 ENCOUNTER — Ambulatory Visit
